# Patient Record
Sex: Male | Born: 1990 | Race: White | Hispanic: No | Marital: Single | Smoking: Current every day smoker
Health system: Southern US, Community
[De-identification: ages and names within clinical notes are randomized; demographics above are authoritative.]

## PROBLEM LIST (undated history)

## (undated) DIAGNOSIS — F319 Bipolar disorder, unspecified: Secondary | ICD-10-CM

## (undated) DIAGNOSIS — A6000 Herpesviral infection of urogenital system, unspecified: Secondary | ICD-10-CM

## (undated) DIAGNOSIS — F1510 Other stimulant abuse, uncomplicated: Secondary | ICD-10-CM

## (undated) DIAGNOSIS — F909 Attention-deficit hyperactivity disorder, unspecified type: Secondary | ICD-10-CM

## (undated) DIAGNOSIS — F419 Anxiety disorder, unspecified: Secondary | ICD-10-CM

## (undated) DIAGNOSIS — F410 Panic disorder [episodic paroxysmal anxiety] without agoraphobia: Secondary | ICD-10-CM

---
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
## 2012-01-10 ENCOUNTER — Encounter (HOSPITAL_COMMUNITY): Payer: Self-pay | Admitting: *Deleted

## 2012-01-10 ENCOUNTER — Emergency Department (HOSPITAL_COMMUNITY)
Admission: EM | Admit: 2012-01-10 | Discharge: 2012-01-10 | Disposition: A | Discharge status: Home / Self Care | Payer: Medicaid Other | Attending: Emergency Medicine | Admitting: Emergency Medicine

## 2012-01-10 DIAGNOSIS — R42 Dizziness and giddiness: Secondary | ICD-10-CM | POA: Insufficient documentation

## 2012-01-10 DIAGNOSIS — R52 Pain, unspecified: Secondary | ICD-10-CM

## 2012-01-10 DIAGNOSIS — F17200 Nicotine dependence, unspecified, uncomplicated: Secondary | ICD-10-CM | POA: Insufficient documentation

## 2012-01-10 MED ORDER — NAPROXEN 500 MG PO TABS: 500.0000 mg | ORAL_TABLET | Freq: Two times a day (BID) | ORAL | Status: DC

## 2012-01-10 MED ORDER — NAPROXEN 250 MG PO TABS
500.0000 mg | ORAL_TABLET | Freq: Once | ORAL | Status: AC
  Administered 2012-01-10: 500 mg via ORAL
  Filled 2012-01-10: qty 2
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
## 2012-01-21 ENCOUNTER — Emergency Department (HOSPITAL_COMMUNITY)
Admission: EM | Admit: 2012-01-21 | Discharge: 2012-01-21 | Discharge status: Against Medical Advice | Payer: Medicaid Other | Attending: Emergency Medicine | Admitting: Emergency Medicine

## 2012-01-21 ENCOUNTER — Encounter (HOSPITAL_COMMUNITY): Payer: Self-pay | Admitting: *Deleted

## 2012-01-21 DIAGNOSIS — R51 Headache: Secondary | ICD-10-CM | POA: Insufficient documentation

## 2012-01-21 DIAGNOSIS — M545 Low back pain, unspecified: Secondary | ICD-10-CM | POA: Insufficient documentation

## 2012-01-21 NOTE — ED Notes (Signed)
No answer
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
## 2012-04-21 ENCOUNTER — Emergency Department (HOSPITAL_COMMUNITY)
Admission: EM | Admit: 2012-04-21 | Discharge: 2012-04-21 | Disposition: A | Discharge status: Home / Self Care | Payer: Medicaid Other | Attending: Emergency Medicine | Admitting: Emergency Medicine

## 2012-04-21 ENCOUNTER — Emergency Department (HOSPITAL_COMMUNITY): Payer: Medicaid Other

## 2012-04-21 ENCOUNTER — Encounter (HOSPITAL_COMMUNITY): Payer: Self-pay | Admitting: *Deleted

## 2012-04-21 DIAGNOSIS — J40 Bronchitis, not specified as acute or chronic: Secondary | ICD-10-CM | POA: Insufficient documentation

## 2012-04-21 DIAGNOSIS — R059 Cough, unspecified: Secondary | ICD-10-CM | POA: Insufficient documentation

## 2012-04-21 DIAGNOSIS — F410 Panic disorder [episodic paroxysmal anxiety] without agoraphobia: Secondary | ICD-10-CM | POA: Insufficient documentation

## 2012-04-21 DIAGNOSIS — Z79899 Other long term (current) drug therapy: Secondary | ICD-10-CM | POA: Insufficient documentation

## 2012-04-21 DIAGNOSIS — R0602 Shortness of breath: Secondary | ICD-10-CM | POA: Insufficient documentation

## 2012-04-21 DIAGNOSIS — F17200 Nicotine dependence, unspecified, uncomplicated: Secondary | ICD-10-CM | POA: Insufficient documentation

## 2012-04-21 DIAGNOSIS — R05 Cough: Secondary | ICD-10-CM | POA: Insufficient documentation

## 2012-04-21 DIAGNOSIS — J9801 Acute bronchospasm: Secondary | ICD-10-CM | POA: Insufficient documentation

## 2012-04-21 DIAGNOSIS — F411 Generalized anxiety disorder: Secondary | ICD-10-CM | POA: Insufficient documentation

## 2012-04-21 MED ORDER — ALBUTEROL SULFATE (5 MG/ML) 0.5% IN NEBU
5.0000 mg | INHALATION_SOLUTION | Freq: Once | RESPIRATORY_TRACT | Status: AC
  Administered 2012-04-21: 5 mg via RESPIRATORY_TRACT
  Filled 2012-04-21: qty 1

## 2012-04-21 MED ORDER — PREDNISONE 10 MG PO TABS: 20.0000 mg | ORAL_TABLET | Freq: Every day | ORAL | Status: DC

## 2012-04-21 MED ORDER — PREDNISONE 50 MG PO TABS
60.0000 mg | ORAL_TABLET | Freq: Once | ORAL | Status: AC
  Administered 2012-04-21: 60 mg via ORAL
  Filled 2012-04-21: qty 1

## 2012-04-21 MED ORDER — IPRATROPIUM BROMIDE 0.02 % IN SOLN
0.5000 mg | Freq: Once | RESPIRATORY_TRACT | Status: AC
  Administered 2012-04-21: 0.5 mg via RESPIRATORY_TRACT
  Filled 2012-04-21: qty 2.5

## 2012-04-21 MED ORDER — ALBUTEROL SULFATE (5 MG/ML) 0.5% IN NEBU: 2.5000 mg | INHALATION_SOLUTION | Freq: Once | RESPIRATORY_TRACT | Status: DC

## 2012-04-21 MED ORDER — AMOXICILLIN 500 MG PO CAPS: 500.0000 mg | ORAL_CAPSULE | Freq: Three times a day (TID) | ORAL | Status: DC

## 2012-04-21 NOTE — ED Notes (Signed)
Nasal congestion since last night.

## 2012-04-21 NOTE — ED Provider Notes (Signed)
History   This chart was scribed for Benny Lennert, MD, by Frederik Pear, ER scribe. The patient was seen in room APA03/APA03 and the patient's care was started at 0901.    CSN: [id number]  Arrival date & time 04/21/12  7829   First MD Initiated Contact with Patient 04/21/12 0901      Chief Complaint  Patient presents with  . Nasal Congestion    (Consider location/radiation/quality/duration/timing/severity/associated sxs/prior treatment) HPI Comments: Scott Huber is a 21 y.o. male with a h/o of asthma who presents to the Emergency Department complaining of constant, moderate SOB with associated productive cough that began last night. He reports that he has been using his albuterol inhaler at home every 4 hours with no relief. He denies any nausea, vomiting, diarrhea, or chills. He states that is a current every day 1 pack a day smoker.       Patient is a 21 y.o. male presenting with shortness of breath. The history is provided by the patient.  Shortness of Breath  The current episode started yesterday. The onset was sudden. The problem occurs continuously. The problem has been unchanged. The problem is moderate. Associated symptoms include cough and shortness of breath. Pertinent negatives include no chest pain. His past medical history is significant for asthma.    Past Medical History  Diagnosis Date  . Anxiety   . Panic attack     History reviewed. No pertinent past surgical history.  No family history on file.  History  Substance Use Topics  . Smoking status: Current Every Day Smoker -- 2.0 packs/day    Types: Cigarettes  . Smokeless tobacco: Never Used  . Alcohol Use: Yes     Comment: occationally      Review of Systems  HENT: Negative for ear discharge.   Eyes: Negative for discharge.  Respiratory: Positive for cough and shortness of breath.   Cardiovascular: Negative for chest pain.  Gastrointestinal: Negative for diarrhea.  Genitourinary: Negative  for frequency and hematuria.  Musculoskeletal: Negative for back pain.  Neurological: Negative for seizures.  Hematological: Negative.   Psychiatric/Behavioral: Negative for hallucinations.  All other systems reviewed and are negative.    Allergies  Hydrocodone and Vistaril  Home Medications   Current Outpatient Rx  Name  Route  Sig  Dispense  Refill  . ALBUTEROL SULFATE HFA 108 (90 BASE) MCG/ACT IN AERS   Inhalation   Inhale 1-2 puffs into the lungs every 6 (six) hours as needed for wheezing.   1 Inhaler   0     BP 138/74  Pulse 95  Temp 98.2 F (36.8 C) (Oral)  Resp 18  Ht 5\' 9"  (1.753 m)  Wt 189 lb (85.73 kg)  BMI 27.91 kg/m2  SpO2 100%  Physical Exam  Constitutional: He is oriented to person, place, and time. He appears well-developed.  HENT:  Head: Normocephalic and atraumatic.  Eyes: Conjunctivae normal and EOM are normal. No scleral icterus.  Neck: Neck supple. No thyromegaly present.  Cardiovascular: Normal rate and regular rhythm.  Exam reveals no gallop and no friction rub.   No murmur heard. Pulmonary/Chest: No stridor. He has wheezes. He has no rales. He exhibits no tenderness.       He has moderate wheezing bilaterally.  Abdominal: He exhibits no distension. There is no tenderness. There is no rebound.  Musculoskeletal: Normal range of motion. He exhibits no edema.  Lymphadenopathy:    He has no cervical adenopathy.  Neurological: He is oriented  to person, place, and time. Coordination normal.  Skin: No rash noted. No erythema.  Psychiatric: He has a normal mood and affect. His behavior is normal.    ED Course  Procedures (including critical care time)  DIAGNOSTIC STUDIES: Oxygen Saturation is 98% on room air, normal by my interpretation.    COORDINATION OF CARE:  09:10- Discussed planned course of treatment with the patient, including a breathing treatment and chest X-ray, who is agreeable at this time.  09:15- Medication Orders- albuterol  (PROVENTIL)(5MG /ML) 0.5% nebulizer 2.5 mg- Once, albuterol (PROVENTIL) (5MG /ML) 0.5% nebulizer solution 5 mg-Once, ipratropium (ATROVENT) nebulizer solution 0.5 mg- Once.  10:17- Recheck- Discussed X-ray results with the pt. He denied any allergies to medication except hydrocodone. Discussed discharging and continuation of his inhalers at home.  Labs Reviewed - No data to display Dg Chest 2 View  04/21/2012  *RADIOLOGY REPORT*  Clinical Data: Congestion, wheezing, smoking.  CHEST - 2 VIEW  Comparison: 01/02/2011 radiograph  Findings: Mild hilar fullness/central peribronchial thickening is similar to priors.  No confluent airspace opacity.  No pleural effusion or pneumothorax.  Cardiac contour within normal range.  No acute osseous finding.  IMPRESSION: Mild peribronchial thickening may be chronic or acute on chronic.   Original Report Authenticated By: Jearld Lesch, M.D.      No diagnosis found.    MDM    The chart was scribed for me under my direct supervision.  I personally performed the history, physical, and medical decision making and all procedures in the evaluation of this patient.Benny Lennert, MD 04/21/12 1018

## 2012-09-07 ENCOUNTER — Encounter (HOSPITAL_COMMUNITY): Payer: Self-pay

## 2012-09-07 ENCOUNTER — Emergency Department (HOSPITAL_COMMUNITY)
Admission: EM | Admit: 2012-09-07 | Discharge: 2012-09-07 | Disposition: A | Discharge status: Home / Self Care | Payer: Medicaid Other | Attending: Emergency Medicine | Admitting: Emergency Medicine

## 2012-09-07 DIAGNOSIS — F410 Panic disorder [episodic paroxysmal anxiety] without agoraphobia: Secondary | ICD-10-CM | POA: Insufficient documentation

## 2012-09-07 DIAGNOSIS — A6000 Herpesviral infection of urogenital system, unspecified: Secondary | ICD-10-CM | POA: Insufficient documentation

## 2012-09-07 DIAGNOSIS — F17200 Nicotine dependence, unspecified, uncomplicated: Secondary | ICD-10-CM | POA: Insufficient documentation

## 2012-09-07 DIAGNOSIS — Z79899 Other long term (current) drug therapy: Secondary | ICD-10-CM | POA: Insufficient documentation

## 2012-09-07 MED ORDER — VALACYCLOVIR HCL 1 G PO TABS: 1000.0000 mg | ORAL_TABLET | Freq: Three times a day (TID) | ORAL | Status: AC

## 2012-09-07 MED ORDER — ACYCLOVIR 400 MG PO TABS: 400.0000 mg | ORAL_TABLET | Freq: Three times a day (TID) | ORAL | Status: DC

## 2012-09-07 NOTE — ED Notes (Signed)
Pt. Is having an outbreak of herpes, Need medication. Pt. Denies any drainage but is having pain

## 2012-09-07 NOTE — ED Notes (Signed)
Pt is here with significant other for herpes outbreak.  States "red spots are about to open up".

## 2012-09-07 NOTE — ED Provider Notes (Signed)
History    This chart was scribed for Marlon Pel (PA) non-physician practitioner working with Doug Sou, MD by Sofie Rower, ED Scribe. This patient was seen in room TR07C/TR07C and the patient's care was started at 4:44PM.   CSN: [id number]  Arrival date & time 09/07/12  1616   First MD Initiated Contact with Patient 09/07/12 1644      Chief Complaint  Patient presents with  . Herpes Zoster    (Consider location/radiation/quality/duration/timing/severity/associated sxs/prior treatment) The history is provided by the patient. No language interpreter was used.    Scott Huber is a 22 y.o. male , with a hx of anxiety and panic attack, who presents to the Emergency Department complaining of sudden, progressively worsening, herpes zoster, onset today (09/07/12). The pt reports he has recently begun to experience an outbreak of herpes, along with his significant other, whom was evaluated within [facility] earlier this afternoon, and now desires medical evaluation and treatment.   The pt is a current everyday smoker, in addition to drinking alcohol occasionally.   PCP is Dr. Dimas Aguas.    Past Medical History  Diagnosis Date  . Anxiety   . Panic attack     History reviewed. No pertinent past surgical history.  No family history on file.  History  Substance Use Topics  . Smoking status: Current Every Day Smoker -- 2.00 packs/day    Types: Cigarettes  . Smokeless tobacco: Never Used  . Alcohol Use: Yes     Comment: occationally      Review of Systems  All other systems reviewed and are negative.    Allergies  Hydrocodone and Vistaril  Home Medications   Current Outpatient Rx  Name  Route  Sig  Dispense  Refill  . acyclovir (ZOVIRAX) 400 MG tablet   Oral   Take 1 tablet (400 mg total) by mouth 3 (three) times daily.   30 tablet   2   . valACYclovir (VALTREX) 1000 MG tablet   Oral   Take 1 tablet (1,000 mg total) by mouth 3 (three) times daily.   21 tablet   0      BP 101/53  Pulse 89  Temp(Src) 98.9 F (37.2 C) (Oral)  Resp 20  SpO2 100%  Physical Exam  Nursing note and vitals reviewed. Constitutional: He is oriented to person, place, and time. He appears well-developed and well-nourished. No distress.  HENT:  Head: Normocephalic and atraumatic.  Eyes: EOM are normal.  Neck: Neck supple. No tracheal deviation present.  Cardiovascular: Normal rate.   Pulmonary/Chest: Effort normal. No respiratory distress.  Musculoskeletal: Normal range of motion.  Neurological: He is alert and oriented to person, place, and time.  Skin: Skin is warm and dry.  Psychiatric: He has a normal mood and affect. His behavior is normal.    ED Course  Procedures (including critical care time)  DIAGNOSTIC STUDIES: Oxygen Saturation is 100% on room air, normal by my interpretation.    COORDINATION OF CARE:  4:47 PM- Treatment plan discussed with patient. Pt agrees with treatment.   acyclovir (ZOVIRAX) 400 MG tablet Take 1 tablet (400 mg total) by mouth 3 (three) times daily. 30 tablet Dorthula Matas, PA-C    Labs Reviewed - No data to display No results found.   1. Genital herpes       MDM  Pt has been advised of the symptoms that warrant their return to the ED. Patient has voiced understanding and has agreed to follow-up with the  PCP or specialist.  I personally performed the services described in this documentation, which was scribed in my presence. The recorded information has been reviewed and is accurate.    Dorthula Matas, PA-C 09/07/12 1651

## 2012-09-08 NOTE — ED Provider Notes (Signed)
Medical screening examination/treatment/procedure(s) were performed by non-physician practitioner and as supervising physician I was immediately available for consultation/collaboration.  Doug Sou, MD 09/08/12 [phone]

## 2012-10-03 ENCOUNTER — Emergency Department (HOSPITAL_COMMUNITY): Payer: Medicaid Other

## 2012-10-03 ENCOUNTER — Emergency Department (HOSPITAL_COMMUNITY)
Admission: EM | Admit: 2012-10-03 | Discharge: 2012-10-03 | Discharge status: Against Medical Advice | Payer: Medicaid Other | Attending: Emergency Medicine | Admitting: Emergency Medicine

## 2012-10-03 ENCOUNTER — Encounter (HOSPITAL_COMMUNITY): Payer: Self-pay | Admitting: Emergency Medicine

## 2012-10-03 DIAGNOSIS — R519 Headache, unspecified: Secondary | ICD-10-CM

## 2012-10-03 DIAGNOSIS — G8929 Other chronic pain: Secondary | ICD-10-CM | POA: Insufficient documentation

## 2012-10-03 DIAGNOSIS — R51 Headache: Secondary | ICD-10-CM | POA: Insufficient documentation

## 2012-10-03 DIAGNOSIS — F411 Generalized anxiety disorder: Secondary | ICD-10-CM | POA: Insufficient documentation

## 2012-10-03 DIAGNOSIS — F17200 Nicotine dependence, unspecified, uncomplicated: Secondary | ICD-10-CM | POA: Insufficient documentation

## 2012-10-03 DIAGNOSIS — H53149 Visual discomfort, unspecified: Secondary | ICD-10-CM | POA: Insufficient documentation

## 2012-10-03 MED ORDER — KETOROLAC TROMETHAMINE 60 MG/2ML IM SOLN
60.0000 mg | Freq: Once | INTRAMUSCULAR | Status: DC
  Filled 2012-10-03: qty 2

## 2012-10-03 NOTE — ED Notes (Signed)
Radiology went in to get patient for head CT, patient no longer in room.

## 2012-10-03 NOTE — ED Notes (Signed)
Pt c/o headache x one month and pressure around his eyes x 2 days.

## 2012-10-03 NOTE — ED Notes (Signed)
Patient having issues with taking a shot and finally refuses. Wants to know how long it will take for the CT scan.

## 2012-10-03 NOTE — ED Provider Notes (Signed)
History     CSN: [id number]  Arrival date & time 10/03/12  4098   First MD Initiated Contact with Patient 10/03/12 1905      Chief Complaint  Patient presents with  . Headache  . Facial Pain    (Consider location/radiation/quality/duration/timing/severity/associated sxs/prior treatment) HPI Comments: Scott Huber is a 22 y.o. Male presenting with a 1 month history of a constant headache which is bilateral and posterior and started within a week after falling off a scaffolding landing in grass on his back and posterior head.  He states he was not evaluated for that injury.  He is unable to sleep,  Although he lies down at night,  He does not feel he has rested when he wakes.  His headache is throbbing in character and does improve despite multiple doses of ibuprofen 800 mg tablets.  He has developed pressure around his eyes over the past several days.  He denies fevers, chills, dizziness,  Nasal congestion, sore throat, visual changes, although states he has increased sensitivity to light,  Denies focal numbness or weakness and denies neck pain or stiffness.     The history is provided by the patient.    Past Medical History  Diagnosis Date  . Anxiety   . Panic attack     History reviewed. No pertinent past surgical history.  History reviewed. No pertinent family history.  History  Substance Use Topics  . Smoking status: Current Every Day Smoker -- 2.00 packs/day    Types: Cigarettes  . Smokeless tobacco: Never Used  . Alcohol Use: Yes     Comment: occationally      Review of Systems  Constitutional: Negative for fever and chills.  HENT: Negative for hearing loss, congestion, sore throat, rhinorrhea, neck pain, neck stiffness, postnasal drip and sinus pressure.   Eyes: Positive for photophobia. Negative for visual disturbance.  Respiratory: Negative for chest tightness and shortness of breath.   Cardiovascular: Negative for chest pain.  Gastrointestinal: Negative  for nausea, vomiting and abdominal pain.  Genitourinary: Negative.   Musculoskeletal: Negative for joint swelling and arthralgias.  Skin: Negative.  Negative for rash and wound.  Neurological: Positive for headaches. Negative for dizziness, weakness, light-headedness and numbness.  Psychiatric/Behavioral: Negative.     Allergies  Hydrocodone and Vistaril  Home Medications   Current Outpatient Rx  Name  Route  Sig  Dispense  Refill  . ibuprofen (ADVIL,MOTRIN) 200 MG tablet   Oral   Take 800 mg by mouth every 6 (six) hours as needed for pain.           BP 116/69  Pulse 66  Temp(Src) 97.6 F (36.4 C) (Oral)  Resp 16  Ht 5\' 9"  (1.753 m)  Wt 179 lb (81.194 kg)  BMI 26.42 kg/m2  SpO2 99%  Physical Exam  Nursing note and vitals reviewed. Constitutional: He is oriented to person, place, and time. He appears well-developed and well-nourished.  Uncomfortable appearing  HENT:  Head: Normocephalic and atraumatic.  Mouth/Throat: Oropharynx is clear and moist.  Eyes: EOM are normal. Pupils are equal, round, and reactive to light.  Neck: Normal range of motion. Neck supple.  Cardiovascular: Normal rate and normal heart sounds.   Pulmonary/Chest: Effort normal.  Abdominal: Soft. There is no tenderness.  Musculoskeletal: Normal range of motion.  Lymphadenopathy:    He has no cervical adenopathy.  Neurological: He is alert and oriented to person, place, and time. He has normal strength. No sensory deficit. Gait normal. GCS eye  subscore is 4. GCS verbal subscore is 5. GCS motor subscore is 6.  Normal heel-shin, normal rapid alternating movements. Cranial nerves III-XII intact.  No pronator drift.  Skin: Skin is warm and dry. No rash noted.  Psychiatric: His speech is normal and behavior is normal. Thought content normal. His mood appears anxious. Cognition and memory are normal.    ED Course  Procedures (including critical care time)  Labs Reviewed - No data to display No  results found.   1. 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  Pt left [facility] prior to receiving Ct.  He did not inform staff he was leaving.        Burgess Amor, PA-C 10/03/12 2056

## 2012-10-04 NOTE — ED Provider Notes (Signed)
Medical screening examination/treatment/procedure(s) were performed by non-physician practitioner and as supervising physician I was immediately available for consultation/collaboration.  Juliet Rude. Rubin Payor, MD 10/04/12 4098

## 2012-12-17 ENCOUNTER — Emergency Department (HOSPITAL_COMMUNITY)
Admission: EM | Admit: 2012-12-17 | Discharge: 2012-12-17 | Disposition: A | Discharge status: Home / Self Care | Payer: Medicaid Other | Attending: Emergency Medicine | Admitting: Emergency Medicine

## 2012-12-17 DIAGNOSIS — Z760 Encounter for issue of repeat prescription: Secondary | ICD-10-CM | POA: Insufficient documentation

## 2012-12-17 DIAGNOSIS — F411 Generalized anxiety disorder: Secondary | ICD-10-CM | POA: Insufficient documentation

## 2012-12-17 DIAGNOSIS — R450 Nervousness: Secondary | ICD-10-CM | POA: Insufficient documentation

## 2012-12-17 DIAGNOSIS — F17200 Nicotine dependence, unspecified, uncomplicated: Secondary | ICD-10-CM | POA: Insufficient documentation

## 2012-12-17 DIAGNOSIS — A6000 Herpesviral infection of urogenital system, unspecified: Secondary | ICD-10-CM | POA: Insufficient documentation

## 2012-12-17 DIAGNOSIS — IMO0002 Reserved for concepts with insufficient information to code with codable children: Secondary | ICD-10-CM | POA: Insufficient documentation

## 2012-12-17 MED ORDER — ACYCLOVIR 400 MG PO TABS: 400.0000 mg | ORAL_TABLET | Freq: Three times a day (TID) | ORAL | Status: DC

## 2012-12-17 NOTE — ED Notes (Signed)
Pt alert & oriented x4, stable gait. Patient  given discharge instructions, paperwork & prescription(s). Patient verbalized understanding. Pt left department w/ no further questions. 

## 2012-12-17 NOTE — ED Notes (Signed)
States that he has herpes and needs a refill on his medication.

## 2012-12-17 NOTE — ED Provider Notes (Signed)
CSN: [id number]     Arrival date & time 12/17/12  1708 History     First MD Initiated Contact with Patient 12/17/12 1858     Chief Complaint  Patient presents with  . Exposure to STD  . Medication Refill   (Consider location/radiation/quality/duration/timing/severity/associated sxs/prior Treatment) HPI Comments: Patient states that he has had problems with genital herpes for some time now. He states that he has been on acyclovir, but is now out of the medication. He states he is experiencing an outbreak now. He denies fever chills, nausea vomiting,. He denies any unusual headache. There's been no involvement of his eyes. The patient states he has not been seen by the health department or any other practitioner since his last visit to the emergency department.  The history is provided by the patient.    Past Medical History  Diagnosis Date  . Anxiety   . Panic attack    No past surgical history on file. No family history on file. History  Substance Use Topics  . Smoking status: Current Every Day Smoker -- 2.00 packs/day    Types: Cigarettes  . Smokeless tobacco: Never Used  . Alcohol Use: Yes     Comment: occationally    Review of Systems  Constitutional: Negative for activity change.       All ROS Neg except as noted in HPI  HENT: Negative for nosebleeds and neck pain.   Eyes: Negative for photophobia and discharge.  Respiratory: Negative for cough, shortness of breath and wheezing.   Cardiovascular: Negative for chest pain and palpitations.  Gastrointestinal: Negative for abdominal pain and blood in stool.  Genitourinary: Positive for penile pain. Negative for dysuria, frequency and hematuria.  Musculoskeletal: Negative for back pain and arthralgias.  Skin: Negative.   Neurological: Negative for dizziness, seizures and speech difficulty.  Psychiatric/Behavioral: Negative for hallucinations and confusion. The patient is nervous/anxious.     Allergies  Hydrocodone and  Vistaril  Home Medications   Current Outpatient Rx  Name  Route  Sig  Dispense  Refill  . ibuprofen (ADVIL,MOTRIN) 200 MG tablet   Oral   Take 800 mg by mouth every 6 (six) hours as needed for pain.          BP 111/70  Pulse 67  Temp(Src) 98.4 F (36.9 C) (Oral)  Resp 18  Ht 5\' 9"  (1.753 m)  Wt 176 lb 2 oz (79.89 kg)  BMI 26 kg/m2  SpO2 98% Physical Exam  Nursing note and vitals reviewed. Constitutional: He is oriented to person, place, and time. He appears well-developed and well-nourished.  Non-toxic appearance.  HENT:  Head: Normocephalic.  Right Ear: Tympanic membrane and external ear normal.  Left Ear: Tympanic membrane and external ear normal.  Eyes: EOM and lids are normal. Pupils are equal, round, and reactive to light.  Neck: Normal range of motion. Neck supple. Carotid bruit is not present.  Cardiovascular: Normal rate, regular rhythm, normal heart sounds, intact distal pulses and normal pulses.   Pulmonary/Chest: Breath sounds normal. No respiratory distress.  Abdominal: Soft. Bowel sounds are normal. There is no tenderness. There is no guarding.  Genitourinary: Penile tenderness present.  Musculoskeletal: Normal range of motion.  Lymphadenopathy:       Head (right side): No submandibular adenopathy present.       Head (left side): No submandibular adenopathy present.    He has no cervical adenopathy.  Neurological: He is alert and oriented to person, place, and time. He has normal strength.  No cranial nerve deficit or sensory deficit.  Skin: Skin is warm and dry.  Psychiatric: He has a normal mood and affect. His speech is normal.    ED Course   Procedures (including critical care time)  Labs Reviewed - No data to display No results found. No diagnosis found.  MDM  *I have reviewed nursing notes, vital signs, and all appropriate lab and imaging results for this patient.** Patient has a history of genital herpes, with a current outbreak. The patient  states that he is out of his acyclovir, and has been out quite some time. He has not seen a medical professionals since his last emergency department visit. I have instructed the patient on the importance of being followed at the local health department. Prescription for acyclovir  400 mg 3 times daily #30 tablets given to the patient.  Kathie Dike, PA-C 12/17/12 [phone]

## 2012-12-22 NOTE — ED Provider Notes (Signed)
History/physical exam/procedure(s) were performed by non-physician practitioner and as supervising physician I was immediately available for consultation/collaboration. I have reviewed all notes and am in agreement with care and plan.   Kinsleigh Ludolph S Dyna Figuereo, MD 12/22/12 1242 

## 2013-11-01 ENCOUNTER — Encounter (HOSPITAL_COMMUNITY): Payer: Self-pay | Admitting: Emergency Medicine

## 2013-11-01 ENCOUNTER — Emergency Department (HOSPITAL_COMMUNITY)
Admission: EM | Admit: 2013-11-01 | Discharge: 2013-11-01 | Disposition: A | Discharge status: Home / Self Care | Payer: Medicaid Other | Attending: Emergency Medicine | Admitting: Emergency Medicine

## 2013-11-01 DIAGNOSIS — Z79899 Other long term (current) drug therapy: Secondary | ICD-10-CM | POA: Insufficient documentation

## 2013-11-01 DIAGNOSIS — Z760 Encounter for issue of repeat prescription: Secondary | ICD-10-CM | POA: Diagnosis present

## 2013-11-01 DIAGNOSIS — Z8659 Personal history of other mental and behavioral disorders: Secondary | ICD-10-CM | POA: Diagnosis not present

## 2013-11-01 DIAGNOSIS — F17200 Nicotine dependence, unspecified, uncomplicated: Secondary | ICD-10-CM | POA: Insufficient documentation

## 2013-11-01 DIAGNOSIS — A6000 Herpesviral infection of urogenital system, unspecified: Secondary | ICD-10-CM | POA: Insufficient documentation

## 2013-11-01 MED ORDER — ACYCLOVIR 400 MG PO TABS: 400.0000 mg | ORAL_TABLET | Freq: Three times a day (TID) | ORAL | Status: DC

## 2013-11-01 NOTE — ED Provider Notes (Signed)
CSN: [id number]     Arrival date & time 11/01/13  2116 History   First MD Initiated Contact with Patient 11/01/13 2214     Chief Complaint  Patient presents with  . Medication Refill    (Consider location/radiation/quality/duration/timing/severity/associated sxs/prior Treatment) HPI Comments: Patient is a 23 year old male with a history of genital herpes who presents to the emergency department for acyclovir your refill. Patient states that he began another herpes outbreak one month ago and that he ran out of his medication for management of this. Patient states that his outbreak has been resolving spontaneously, but is still present and painful. Patient denies associated fever, abdominal pain, nausea or vomiting, penile discharge, scrotal swelling, and testicular tenderness.  The history is provided by the patient. No language interpreter was used.    Past Medical History  Diagnosis Date  . Anxiety   . Panic attack    History reviewed. No pertinent past surgical history. History reviewed. No pertinent family history. History  Substance Use Topics  . Smoking status: Current Every Day Smoker -- 2.00 packs/day    Types: Cigarettes  . Smokeless tobacco: Never Used  . Alcohol Use: Yes     Comment: occationally    Review of Systems  Genitourinary:       +penile lesions  All other systems reviewed and are negative.   Allergies  Hydrocodone and Vistaril  Home Medications   Prior to Admission medications   Medication Sig Start Date End Date Taking? Authorizing Kadir Azucena  acyclovir (ZOVIRAX) 400 MG tablet Take 1 tablet (400 mg total) by mouth 3 (three) times daily. 11/01/13   Antony MaduraKelly Humes, PA-C  ibuprofen (ADVIL,MOTRIN) 200 MG tablet Take 800 mg by mouth every 6 (six) hours as needed for pain.    Historical Vitalia Stough, MD   BP 123/65  Pulse 91  Temp(Src) 98.3 F (36.8 C) (Oral)  Resp 18  Ht 5\' 10"  (1.778 m)  Wt 175 lb (79.379 kg)  BMI 25.11 kg/m2  SpO2 98%  Physical Exam   Nursing note and vitals reviewed. Constitutional: He is oriented to person, place, and time. He appears well-developed and well-nourished. No distress.  Nontoxic/nonseptic appearing  HENT:  Head: Normocephalic and atraumatic.  Eyes: Conjunctivae and EOM are normal. No scleral icterus.  Neck: Normal range of motion.  Pulmonary/Chest: Effort normal. No respiratory distress.  Abdominal: Soft. He exhibits no distension. There is no tenderness. There is no rebound and no guarding.  Soft and nontender. No peritoneal signs.  Genitourinary: Testes normal. Right testis shows no mass, no swelling and no tenderness. Right testis is descended. Left testis shows no mass, no swelling and no tenderness. Left testis is descended. Circumcised. No penile tenderness. No discharge found.  Chaperoned GU exam significant for lesions consistent with genital herpes present on penile shaft at the base of the glans penis.  Musculoskeletal: Normal range of motion.  Neurological: He is alert and oriented to person, place, and time.  Skin: Skin is warm and dry. No rash noted. He is not diaphoretic. No erythema. No pallor.  Psychiatric: He has a normal mood and affect. His behavior is normal.    ED Course  Procedures (including critical care time) Labs Review Labs Reviewed - No data to display  Imaging Review No results found.   EKG Interpretation None      MDM   Final diagnoses:  Genital herpes  Encounter for medication refill    Uncomplicated medication refill for acyclovir. Patient with history of genital herpes. Physical  exam consistent with herpes outbreak. No complicating physical exam findings. Patient nontoxic/nonseptic appearing, hemodynamically stable, and afebrile. Patient stable for discharge with prescription for acyclovir. Have advised primary care followup as needed. Return precautions provided and patient agreeable to plan with no unaddressed concerns.   Filed Vitals:   11/01/13 2200   BP: 123/65  Pulse: 91  Temp: 98.3 F (36.8 C)  TempSrc: Oral  Resp: 18  Height: 5\' 10"  (1.778 m)  Weight: 175 lb (79.379 kg)  SpO2: 98%       Antony MaduraKelly Humes, PA-C 11/01/13 2227

## 2013-11-01 NOTE — ED Notes (Signed)
Needs Acyclovir refilled.  States he needs it really bad

## 2013-11-01 NOTE — Discharge Instructions (Signed)
Genital Herpes °Genital herpes is a sexually transmitted disease. This means that it is a disease passed by having sex with an infected person. There is no cure for genital herpes. The time between attacks can be months to years. The virus may live in a person but produce no problems (symptoms). This infection can be passed to a baby as it travels down the birth canal (vagina). In a newborn, this can cause central nervous system damage, eye damage, or even death. The virus that causes genital herpes is usually HSV-2 virus. The virus that causes oral herpes is usually HSV-1. The diagnosis (learning what is wrong) is made through culture results. °SYMPTOMS  °Usually symptoms of pain and itching begin a few days to a week after contact. It first appears as small blisters that progress to small painful ulcers which then scab over and heal after several days. It affects the outer genitalia, birth canal, cervix, penis, anal area, buttocks, and thighs. °HOME CARE INSTRUCTIONS  °· Keep ulcerated areas dry and clean. °· Take medications as directed. Antiviral medications can speed up healing. They will not prevent recurrences or cure this infection. These medications can also be taken for suppression if there are frequent recurrences. °· While the infection is active, it is contagious. Avoid all sexual contact during active infections. °· Condoms may help prevent spread of the herpes virus. °· Practice safe sex. °· Wash your hands thoroughly after touching the genital area. °· Avoid touching your eyes after touching your genital area. °· Inform your caregiver if you have had genital herpes and become pregnant. It is your responsibility to insure a safe outcome for your baby in this pregnancy. °· Only take over-the-counter or prescription medicines for pain, discomfort, or fever as directed by your caregiver. °SEEK MEDICAL CARE IF:  °· You have a recurrence of this infection. °· You do not respond to medications and are not  improving. °· You have new sources of pain or discharge which have changed from the original infection. °· You have an oral temperature above 102° F (38.9° C). °· You develop abdominal pain. °· You develop eye pain or signs of eye infection. °Document Released: 04/23/2000 Document Revised: 07/19/2011 Document Reviewed: 05/14/2009 °ExitCare® Patient Information ©2015 [employer]. This information is not intended to replace advice given to you by your health care provider. Make sure you discuss any questions you have with your health care provider. ° °

## 2013-11-02 NOTE — ED Provider Notes (Signed)
Medical screening examination/treatment/procedure(s) were performed by non-physician practitioner and as supervising physician I was immediately available for consultation/collaboration.   EKG Interpretation None       Nathan R. Pickering, MD 11/02/13 0023 

## 2014-03-03 ENCOUNTER — Emergency Department (HOSPITAL_COMMUNITY)
Admission: EM | Admit: 2014-03-03 | Discharge: 2014-03-04 | Disposition: A | Discharge status: Home / Self Care | Payer: Medicaid Other | Attending: Emergency Medicine | Admitting: Emergency Medicine

## 2014-03-03 ENCOUNTER — Encounter (HOSPITAL_COMMUNITY): Payer: Self-pay | Admitting: Emergency Medicine

## 2014-03-03 DIAGNOSIS — N508 Other specified disorders of male genital organs: Secondary | ICD-10-CM | POA: Diagnosis present

## 2014-03-03 DIAGNOSIS — Z8619 Personal history of other infectious and parasitic diseases: Secondary | ICD-10-CM | POA: Insufficient documentation

## 2014-03-03 DIAGNOSIS — I861 Scrotal varices: Secondary | ICD-10-CM | POA: Insufficient documentation

## 2014-03-03 DIAGNOSIS — Z8659 Personal history of other mental and behavioral disorders: Secondary | ICD-10-CM | POA: Diagnosis not present

## 2014-03-03 DIAGNOSIS — Z720 Tobacco use: Secondary | ICD-10-CM | POA: Insufficient documentation

## 2014-03-03 DIAGNOSIS — N50812 Left testicular pain: Secondary | ICD-10-CM

## 2014-03-03 HISTORY — DX: Herpesviral infection of urogenital system, unspecified: A60.00

## 2014-03-03 NOTE — Discharge Instructions (Signed)
Scrotal Masses Scrotal swelling is common in men of all ages. Common types of testicular masses include:   Hydrocele. The most common benign testicular mass in an adult. Hydroceles are generally soft and painless collections of fluid in the scrotal sac. These can rapidly change size as the fluid enters or leaves. Hydroceles can be associated with an underlying cancer of the testicle.  Spermatoceles. Generally soft and painless cyst-like masses in the scrotum that contain fluid, usually above the testicle. They can rapidly change size as the fluid enters or leaves. They are more prominent while standing or exercising. Sometimes, spermatoceles may cause a sensation of heaviness or a dull ache.  Orchitis. Inflammation of the testicle. It is painful and may be associated with a fever or symptoms of a urinary tract infection, including frequent and painful urination. It is common in males who have the mumps.  Varicocele. An enlargement of the veins that drain the testicles. Varicoceles usually occur on the left side of the scrotum. This condition can increase the risk of infertility. Varicocele is sometimes more prominent while standing or exercising. Sometimes, varicoceles may cause a sensation of heaviness or a dull ache.  Inguinal hernia. A bulge caused by a portion of intestine protruding into the scrotum through a weak area in the abdominal muscles. Hernias may or may not be painful. They are soft and usually enlarge with coughing or straining.  Torsion of the testis. This can cause a testicular mass that develops quickly and is associated with tenderness or fever, or both. It is caused by a twisting of the testicle within the sac. It also reduces the blood supply and can destroy the testis if not treated quickly with surgery.  Epididymitis. Inflammation of the epididymis (a structure attached above and behind the testicle), usually caused by a urinary tract infection or a sexually transmitted  infection. This generally shows up as testicular discomfort and swelling and may include pain during urination. It is frequently associated with a testicle infection.  Testicular appendages. Remnants of tissue on the testis present since birth. A testicular appendage can twist on its blood supply and cause pain. In most cases, this is seen as a blue dot on the scrotum.  Hematocele. A collection of blood between the layers of the sac inside the scrotum. It usually is caused by trauma to the scrotum.  Sebaceous cysts. These can be a swelling in the skin of the scrotum and are usually painless.  Cancer (carcinoma) of the skin of the scrotum. It can cause scrotal swelling, but this is rare. Document Released: 10/31/2002 Document Revised: 12/27/2012 Document Reviewed: 10/16/2012 ExitCare Patient Information 2015 [employer]. This information is not intended to replace advice given to you by your health care provider. Make sure you discuss any questions you have with your health care provider.  

## 2014-03-03 NOTE — ED Provider Notes (Signed)
CSN: [id number]     Arrival date & time 03/03/14  2115 History  This chart was scribed for Joya Gaskinsonald W Samone Guhl, MD by Richarda Overlieichard Holland, ED Scribe. This patient was seen in room [facility] and the patient's care was started 11:15 PM.  Chief complaint - scrotal pain  The history is provided by the patient. No language interpreter was used.   HPI Comments: Scott Huber is a 23 y.o. male who presents to the Emergency Department complaining of left testicular pain that worsened tonight.Pt reports he has had a knot on his left testicle for about a year that has been checked out previously. Pt denies any injury to the area. He denies fever, vomiting, penile discharge, abdominal pain, and dysuria as symptoms. He reports no modifying factors at this time.    Past Medical History  Diagnosis Date  . Anxiety   . Panic attack   . Genital herpes    History reviewed. No pertinent past surgical history. History reviewed. No pertinent family history. History  Substance Use Topics  . Smoking status: Current Every Day Smoker -- 2.00 packs/day    Types: Cigarettes  . Smokeless tobacco: Never Used  . Alcohol Use: Yes     Comment: occationally    Review of Systems  Constitutional: Negative for fever.  Gastrointestinal: Negative for vomiting and abdominal pain.  Genitourinary: Positive for testicular pain. Negative for dysuria and discharge.      Allergies  Hydrocodone and Vistaril  Home Medications   Prior to Admission medications   Not on File   BP 114/71  Pulse 91  Temp(Src) 98.1 F (36.7 C) (Oral)  Resp 24  Ht 5\' 10"  (1.778 m)  SpO2 100% Physical Exam  Nursing note and vitals reviewed.  CONSTITUTIONAL: Well developed/well nourished HEAD: Normocephalic/atraumatic EYES: EOMI ENMT: Mucous membranes moist NECK: supple no meningeal signs CV: S1/S2 noted, no murmurs/rubs/gallops noted LUNGS: Lungs are clear to auscultation bilaterally, no apparent distress ABDOMEN: soft,  nontender, no rebound or guarding GU:no cva tenderness, varicocele to left testicle. Mild tenderness noted, no erythema or swelling. No hernia, chaperone present.  NEURO: Pt is awake/alert, moves all extremitiesx4 EXTREMITIES: pulses normal, full ROM SKIN: warm, color normal PSYCH: no abnormalities of mood noted  ED Course  Procedures  DIAGNOSTIC STUDIES: Oxygen Saturation is 100% on RA, normal by my interpretation.    COORDINATION OF CARE: 11:26 PM Discussed treatment plan with pt at bedside and pt agreed to plan.  Highly suspicious for varicocele given exam/history However I advised that I can NOT rule out testicular cancer by exam only Advised close urology followup and likely need for outpatient ultrasound for evaluation His history is not c/w acute testicular torsion Pt agrees with plan   MDM   Final diagnoses:  Pain in left testicle  Left varicocele    Nursing notes including past medical history and social history reviewed and considered in documentation   I personally performed the services described in this documentation, which was scribed in my presence. The recorded information has been reviewed and is accurate.      Joya Gaskinsonald W Tucker Steedley, MD 03/04/14 [id number]

## 2014-03-03 NOTE — ED Notes (Addendum)
Pt states he has had a knot in his left testicle for about a year.

## 2014-03-05 ENCOUNTER — Ambulatory Visit (INDEPENDENT_AMBULATORY_CARE_PROVIDER_SITE_OTHER): Payer: Medicaid Other | Admitting: Urology

## 2014-03-05 DIAGNOSIS — I861 Scrotal varices: Secondary | ICD-10-CM

## 2014-03-05 DIAGNOSIS — Z720 Tobacco use: Secondary | ICD-10-CM

## 2014-05-12 ENCOUNTER — Emergency Department (HOSPITAL_COMMUNITY)
Admission: EM | Admit: 2014-05-12 | Discharge: 2014-05-12 | Disposition: A | Discharge status: Home / Self Care | Payer: Medicaid Other | Attending: Emergency Medicine | Admitting: Emergency Medicine

## 2014-05-12 ENCOUNTER — Encounter (HOSPITAL_COMMUNITY): Payer: Self-pay | Admitting: Emergency Medicine

## 2014-05-12 DIAGNOSIS — A6002 Herpesviral infection of other male genital organs: Secondary | ICD-10-CM

## 2014-05-12 DIAGNOSIS — Z8659 Personal history of other mental and behavioral disorders: Secondary | ICD-10-CM | POA: Diagnosis not present

## 2014-05-12 DIAGNOSIS — Z202 Contact with and (suspected) exposure to infections with a predominantly sexual mode of transmission: Secondary | ICD-10-CM | POA: Diagnosis present

## 2014-05-12 DIAGNOSIS — Z720 Tobacco use: Secondary | ICD-10-CM | POA: Insufficient documentation

## 2014-05-12 LAB — URINALYSIS, ROUTINE W REFLEX MICROSCOPIC
Bilirubin Urine: NEGATIVE
Glucose, UA: NEGATIVE mg/dL
Hgb urine dipstick: NEGATIVE
Ketones, ur: NEGATIVE mg/dL
LEUKOCYTES UA: NEGATIVE
NITRITE: NEGATIVE
Protein, ur: NEGATIVE mg/dL
SPECIFIC GRAVITY, URINE: 1.013 (ref 1.005–1.030)
UROBILINOGEN UA: 1 mg/dL (ref 0.0–1.0)
pH: 6 (ref 5.0–8.0)

## 2014-05-12 MED ORDER — ACYCLOVIR 800 MG PO TABS: 800.0000 mg | ORAL_TABLET | Freq: Two times a day (BID) | ORAL | Status: DC

## 2014-05-12 NOTE — ED Notes (Signed)
Pt refusing GC/Chlmydia swab

## 2014-05-12 NOTE — ED Provider Notes (Signed)
CSN: [id number]     Arrival date & time 05/12/14  2143 History  This chart was scribed for non-physician practitioner, Teressa Lower, NP working with Enid Skeens, MD, by Lionel December, ED Scribe. This patient was seen in room TR08C/TR08C and the patient's care was started at 9:57 PM.   Chief Complaint  Patient presents with  . SEXUALLY TRANSMITTED DISEASE     (Consider location/radiation/quality/duration/timing/severity/associated sxs/prior Treatment) The history is provided by the patient. No language interpreter was used.    HPI Comments: Scott Huber is a 24 y.o. male who presents to the Emergency Department complaining of a penile rash and mild dysuria onset 7 days ago.  He notes that it may be an outbreak of possible herpes. Patient states that he has had a similar outbreak a month ago but states that it resolved on its own without being treated. He denies penile discharge, fever, or chills.    Past Medical History  Diagnosis Date  . Anxiety   . Panic attack   . Genital herpes    History reviewed. No pertinent past surgical history. No family history on file. History  Substance Use Topics  . Smoking status: Current Every Day Smoker -- 2.00 packs/day    Types: Cigarettes  . Smokeless tobacco: Never Used  . Alcohol Use: Yes     Comment: occationally    Review of Systems  Constitutional: Negative for fever and chills.  Genitourinary: Positive for dysuria. Negative for discharge.  Skin: Positive for rash.  All other systems reviewed and are negative.     Allergies  Hydrocodone and Vistaril  Home Medications   Prior to Admission medications   Not on File   BP 124/72 mmHg  Pulse 130  Temp(Src) 97.8 F (36.6 C) (Oral)  Resp 16  SpO2 98% Physical Exam  Constitutional: He is oriented to person, place, and time.  Cardiovascular: Normal rate and regular rhythm.   Pulmonary/Chest: Effort normal and breath sounds normal.  Abdominal: Soft. Bowel sounds are  normal. There is no tenderness.  Genitourinary:  Raised bumps noted to penis:no discharge noted  Musculoskeletal: Normal range of motion.  Neurological: He is alert and oriented to person, place, and time.  Skin: Skin is warm and dry.  Psychiatric: He has a normal mood and affect.  Nursing note and vitals reviewed.   ED Course  Procedures (including critical care time) DIAGNOSTIC STUDIES: Oxygen Saturation is 98% on RA, normal by my interpretation.    COORDINATION OF CARE: 10:03 PM Discussed treatment plan with patient at beside, the patient agrees with the plan and has no further questions at this time.  Labs Review Labs Reviewed  GC/CHLAMYDIA PROBE AMP  URINALYSIS, ROUTINE W REFLEX MICROSCOPIC  HIV ANTIBODY (ROUTINE TESTING)  RPR    Imaging Review No results found.   EKG Interpretation None      MDM   Final diagnoses:  Herpes genitalis in men    Pt treat with acyclovir.std work up done  I personally performed the services described in this documentation, which was scribed in my presence. The recorded information has been reviewed and is accurate.  Teressa Lower, NP 05/13/14 0010  Enid Skeens, MD 05/13/14 [phone]

## 2014-05-12 NOTE — Discharge Instructions (Signed)
Herpes Simplex Herpes simplex is generally classified as Type 1 or Type 2. Type 1 is generally the type that is responsible for cold sores. Type 2 is generally associated with sexually transmitted diseases. We now know that most of the thoughts on these viruses are inaccurate. We find that HSV1 is also present genitally and HSV2 can be present orally, but this will vary in different locations of the world. Herpes simplex is usually detected by doing a culture. Blood tests are also available for this virus; however, the accuracy is often not as good.  PREPARATION FOR TEST No preparation or fasting is necessary. NORMAL FINDINGS  No virus present  No HSV antigens or antibodies present Ranges for normal findings may vary among different laboratories and hospitals. You should always check with your doctor after having lab work or other tests done to discuss the meaning of your test results and whether your values are considered within normal limits. MEANING OF TEST  Your caregiver will go over the test results with you and discuss the importance and meaning of your results, as well as treatment options and the need for additional tests if necessary. OBTAINING THE TEST RESULTS  It is your responsibility to obtain your test results. Ask the lab or department performing the test when and how you will get your results. Document Released: 05/29/2004 Document Revised: 07/19/2011 Document Reviewed: 04/06/2008 Exit[employer] Patient Information 2015 [employer]. This information is not intended to replace advice given to you by your health care provider. Make sure you discuss any questions you have with your health care provider.  

## 2014-05-12 NOTE — ED Notes (Signed)
Pt. requesting STD screening , reports recurring genital herpes with penile rash and mild dysuria onset last week . Denies fever or chills. No penile discharge .

## 2014-05-13 LAB — RPR

## 2014-05-13 LAB — HIV ANTIBODY (ROUTINE TESTING W REFLEX): HIV 1&2 Ab, 4th Generation: NONREACTIVE

## 2014-05-15 LAB — GC/CHLAMYDIA PROBE AMP
CT PROBE, AMP APTIMA: NEGATIVE
GC PROBE AMP APTIMA: NEGATIVE

## 2015-02-07 ENCOUNTER — Encounter (HOSPITAL_COMMUNITY): Payer: Self-pay | Admitting: *Deleted

## 2015-02-07 ENCOUNTER — Emergency Department (HOSPITAL_COMMUNITY): Payer: Medicaid Other

## 2015-02-07 ENCOUNTER — Emergency Department (HOSPITAL_COMMUNITY)
Admission: EM | Admit: 2015-02-07 | Discharge: 2015-02-07 | Discharge status: Against Medical Advice | Payer: Medicaid Other | Attending: Emergency Medicine | Admitting: Emergency Medicine

## 2015-02-07 DIAGNOSIS — Z8619 Personal history of other infectious and parasitic diseases: Secondary | ICD-10-CM | POA: Insufficient documentation

## 2015-02-07 DIAGNOSIS — Z720 Tobacco use: Secondary | ICD-10-CM | POA: Insufficient documentation

## 2015-02-07 DIAGNOSIS — Z79899 Other long term (current) drug therapy: Secondary | ICD-10-CM | POA: Diagnosis not present

## 2015-02-07 DIAGNOSIS — Z8659 Personal history of other mental and behavioral disorders: Secondary | ICD-10-CM | POA: Insufficient documentation

## 2015-02-07 DIAGNOSIS — R109 Unspecified abdominal pain: Secondary | ICD-10-CM | POA: Insufficient documentation

## 2015-02-07 NOTE — ED Notes (Signed)
Dr. Blinda Leatherwood saw pt running out. Informed of AMA status.

## 2015-02-07 NOTE — ED Notes (Signed)
Rad tech in to pick up pt. Pt stated he had to go and left running out.

## 2015-02-07 NOTE — ED Notes (Signed)
Abdominal pain

## 2015-02-07 NOTE — ED Provider Notes (Signed)
CSN: [id number]     Arrival date & time 02/07/15  2118 History  By signing my name below, I, Murriel Hopper, attest that this documentation has been prepared under the direction and in the presence of Gilda Crease, MD. Electronically Signed: Murriel Hopper, ED Scribe. 02/07/2015. 9:32 PM.    Chief Complaint  Patient presents with  . Abdominal Pain      The history is provided by the patient. No language interpreter was used.   HPI Comments: Scott Huber is a 24 y.o. male who presents to the Emergency Department complaining of intermittent abdominal pain that has been present for a few months. Pt states that he had abdominal pain today and states he came to ED today "just to make sure everything is okay". Pt states that nothing in particular brings on his pain and nothing makes it worse. Pt denies vomiting, constipation, diarrhea.     Past Medical History  Diagnosis Date  . Anxiety   . Panic attack   . Genital herpes    No past surgical history on file. No family history on file. Social History  Substance Use Topics  . Smoking status: Current Every Day Smoker -- 2.00 packs/day    Types: Cigarettes  . Smokeless tobacco: Never Used  . Alcohol Use: Yes     Comment: occationally    Review of Systems  Gastrointestinal: Positive for abdominal pain. Negative for nausea, vomiting and diarrhea.  All other systems reviewed and are negative.     Allergies  Hydrocodone and Vistaril  Home Medications   Prior to Admission medications   Medication Sig Start Date End Date Taking? Authorizing Provider  acyclovir (ZOVIRAX) 800 MG tablet Take 1 tablet (800 mg total) by mouth 2 (two) times daily. 05/12/14   Teressa Lower, NP   There were no vitals taken for this visit. Physical Exam  Constitutional: He is oriented to person, place, and time. He appears well-developed and well-nourished. No distress.  HENT:  Head: Normocephalic and atraumatic.  Right Ear: Hearing normal.   Left Ear: Hearing normal.  Nose: Nose normal.  Mouth/Throat: Oropharynx is clear and moist and mucous membranes are normal.  Eyes: Conjunctivae and EOM are normal. Pupils are equal, round, and reactive to light.  Neck: Normal range of motion. Neck supple.  Cardiovascular: Regular rhythm, S1 normal and S2 normal.  Exam reveals no gallop and no friction rub.   No murmur heard. Pulmonary/Chest: Effort normal and breath sounds normal. No respiratory distress. He exhibits no tenderness.  Abdominal: Soft. Normal appearance and bowel sounds are normal. There is no hepatosplenomegaly. There is no tenderness. There is no rebound, no guarding, no tenderness at McBurney's point and negative Murphy's sign. No hernia.  Musculoskeletal: Normal range of motion.  Neurological: He is alert and oriented to person, place, and time. He has normal strength. No cranial nerve deficit or sensory deficit. Coordination normal. GCS eye subscore is 4. GCS verbal subscore is 5. GCS motor subscore is 6.  Skin: Skin is warm, dry and intact. No rash noted. No cyanosis.  Psychiatric: He has a normal mood and affect. His speech is normal and behavior is normal. Thought content normal.  Nursing note and vitals reviewed.   ED Course  Procedures (including critical care time)  DIAGNOSTIC STUDIES: Oxygen Saturation is 98% on room air, normal by my interpretation.    COORDINATION OF CARE: 9:32 PM Discussed treatment plan with pt at bedside and pt agreed to plan.   Labs Review  Labs Reviewed - No data to display  Imaging Review No results found. I have personally reviewed and evaluated these images and lab results as part of my medical decision-making.   EKG Interpretation None      MDM   Final diagnoses:  None   abdominal pain   Patient presented to the ER for evaluation of abdominal pain. He reports that the pain has been present intermittently for months. He cannot identify a the abdomen hurts when it is  present. It is not currently present. Examination was benign. X-ray was ordered, but patient decided that he did not want to wait to have any testing, left AGAINST MEDICAL ADVICE.  I personally performed the services described in this documentation, which was scribed in my presence. The recorded information has been reviewed and is accurate.     Gilda Crease, MD 02/07/15 2159

## 2017-07-04 ENCOUNTER — Encounter (HOSPITAL_COMMUNITY): Payer: Self-pay

## 2017-07-04 ENCOUNTER — Emergency Department (HOSPITAL_COMMUNITY)
Admission: EM | Admit: 2017-07-04 | Discharge: 2017-07-04 | Disposition: A | Discharge status: Home / Self Care | Payer: Medicaid Other | Attending: Emergency Medicine | Admitting: Emergency Medicine

## 2017-07-04 ENCOUNTER — Other Ambulatory Visit: Payer: Self-pay

## 2017-07-04 DIAGNOSIS — L089 Local infection of the skin and subcutaneous tissue, unspecified: Secondary | ICD-10-CM | POA: Diagnosis not present

## 2017-07-04 DIAGNOSIS — F17210 Nicotine dependence, cigarettes, uncomplicated: Secondary | ICD-10-CM | POA: Insufficient documentation

## 2017-07-04 DIAGNOSIS — R590 Localized enlarged lymph nodes: Secondary | ICD-10-CM

## 2017-07-04 MED ORDER — IBUPROFEN 600 MG PO TABS: 600.0000 mg | ORAL_TABLET | Freq: Four times a day (QID) | ORAL | 0 refills | Status: DC | PRN

## 2017-07-04 MED ORDER — IBUPROFEN 800 MG PO TABS
800.0000 mg | ORAL_TABLET | Freq: Once | ORAL | Status: AC
  Administered 2017-07-04: 800 mg via ORAL
  Filled 2017-07-04: qty 1

## 2017-07-04 MED ORDER — CEPHALEXIN 500 MG PO CAPS: 500.0000 mg | ORAL_CAPSULE | Freq: Four times a day (QID) | ORAL | 0 refills | Status: DC

## 2017-07-04 MED ORDER — CEPHALEXIN 500 MG PO CAPS
500.0000 mg | ORAL_CAPSULE | Freq: Once | ORAL | Status: AC
  Administered 2017-07-04: 500 mg via ORAL
  Filled 2017-07-04: qty 1

## 2017-07-04 NOTE — ED Triage Notes (Signed)
Pt has abscess to right upper arm that started a week ago. Pt states it hurts to the touch. No drainage noted.

## 2017-07-04 NOTE — ED Provider Notes (Signed)
[facility] Provider Note   CSN: [id number] Arrival date & time: 07/04/17  1802     History   Chief Complaint Chief Complaint  Patient presents with  . Abscess    HPI Valentino HueGary R Neumeister is a 27 y.o. male presenting with an approximate one week history of a tender nodule at his right medial elbow which started shortly after developing a painful red and swollen right long distal fingertip.  He denies any obvious injury to the finger, works in [employer] and speculates he may have been bit by a spider, but did not witness such an event.  There has been no drainage from the finger and no red streaking, denies pain in the distal forearm, hand or wrist but is starting to develop soreness around the swollen site at the elbow. He denies fever, chills or other complaints.  Denies any other swollen locations. Has had no treatment prior to arrival.  The history is provided by the patient.    Past Medical History:  Diagnosis Date  . Anxiety   . Genital herpes   . Panic attack     There are no active problems to display for this patient.   History reviewed. No pertinent surgical history.     Home Medications    Prior to Admission medications   Not on File    Family History No family history on file.  Social History Social History   Tobacco Use  . Smoking status: Current Every Day Smoker    Packs/day: 1.00    Types: Cigarettes  . Smokeless tobacco: Never Used  Substance Use Topics  . Alcohol use: Yes    Comment: occationally  . Drug use: No     Allergies   Hydrocodone and Vistaril [hydroxyzine hcl]   Review of Systems Review of Systems  Constitutional: Negative for chills and fever.  HENT: Negative for congestion and sore throat.   Eyes: Negative.   Respiratory: Negative for chest tightness and shortness of breath.   Cardiovascular: Negative for chest pain.  Gastrointestinal: Negative for abdominal pain, nausea and vomiting.    Genitourinary: Negative.   Musculoskeletal: Negative for arthralgias, joint swelling and neck pain.  Skin: Positive for color change and wound. Negative for rash.  Neurological: Negative for dizziness, weakness, light-headedness, numbness and headaches.  Psychiatric/Behavioral: Negative.      Physical Exam Updated Vital Signs BP (!) 130/96 (BP Location: Right Arm)   Pulse (!) 110   Temp 98.6 F (37 C) (Oral)   Resp 16   Ht 5\' 9"  (1.753 m)   Wt 95.3 kg (210 lb)   SpO2 96%   BMI 31.01 kg/m   Physical Exam  Constitutional: He appears well-developed and well-nourished. No distress.  HENT:  Head: Normocephalic and atraumatic.  Neck: Normal range of motion. Neck supple.  Cardiovascular: Normal rate.  Pulmonary/Chest: Effort normal. He has no wheezes.  Musculoskeletal: Normal range of motion. He exhibits no edema.  Lymphadenopathy:    He has no cervical adenopathy.    He has no axillary adenopathy.       Right: Epitrochlear adenopathy present.  Right epitrochlear lymphadenitis present. No red streaking.  Forearm is soft, nontender.  Skin: There is erythema.  Localized edema and erythema of right long finger proximal cuticle edge, no induration or drainage, no punctures, ulcers or other loss of skin integrity. The cuticle is lifted from the nail plate. No abscess.     ED Treatments / Results  Labs (all labs ordered  are listed, but only abnormal results are displayed) Labs Reviewed - No data to display  EKG  EKG Interpretation None       Radiology No results found.  Procedures Procedures (including critical care time)  Medications Ordered in ED Medications  cephALEXin (KEFLEX) capsule 500 mg (not administered)  ibuprofen (ADVIL,MOTRIN) tablet 800 mg (not administered)     Initial Impression / Assessment and Plan / ED Course  I have reviewed the triage vital signs and the nursing notes.  Pertinent labs & imaging results that were available during my care of  the patient were reviewed by me and considered in my medical decision making (see chart for details).     Pt with right long finger infection without abscess and suspected inflammatory epitrochlear node of right arm.  Discussed warm compresses to the finger and the lymph node site, abx started, anti inflammatory pain medicine. Discussed recheck of these site by pcp or recheck here if not improved with course of abx or for any worsened sx, pain, edema, erythema, fever.  Discussed he may need further tests if the lymph node does not improve with this tx. Pt understands and agrees with plan and close f/u.  Final Clinical Impressions(s) / ED Diagnoses   Final diagnoses:  Finger infection  Epitrochlear lymphadenopathy    ED Discharge Orders    None       Victoriano Lain 07/05/17 1247    Doug Sou, MD 07/05/17 [phone]

## 2017-07-04 NOTE — Discharge Instructions (Signed)
Take your entire course of the antibiotics prescribed.  Ibuprofen as prescribed can also help you with swelling and pain.  Warm compresses or warm water soaks to your finger and also the site of your swollen lymph node several times daily can help with pain and swelling.  Avoid frequent squeezing or rubbing of the lymph node site at this action can keep the lymph node swollen and tender.  As discussed if the lymph node remains swollen or tender after the antibiotics are completed you should have the site rechecked.

## 2017-07-14 ENCOUNTER — Emergency Department (HOSPITAL_COMMUNITY): Payer: Medicaid Other

## 2017-07-14 ENCOUNTER — Emergency Department (HOSPITAL_COMMUNITY)
Admission: EM | Admit: 2017-07-14 | Discharge: 2017-07-14 | Disposition: A | Discharge status: Home / Self Care | Payer: Medicaid Other | Attending: Emergency Medicine | Admitting: Emergency Medicine

## 2017-07-14 ENCOUNTER — Other Ambulatory Visit: Payer: Self-pay

## 2017-07-14 ENCOUNTER — Encounter (HOSPITAL_COMMUNITY): Payer: Self-pay | Admitting: Emergency Medicine

## 2017-07-14 DIAGNOSIS — F17210 Nicotine dependence, cigarettes, uncomplicated: Secondary | ICD-10-CM | POA: Diagnosis not present

## 2017-07-14 DIAGNOSIS — L049 Acute lymphadenitis, unspecified: Secondary | ICD-10-CM

## 2017-07-14 DIAGNOSIS — M79621 Pain in right upper arm: Secondary | ICD-10-CM | POA: Diagnosis present

## 2017-07-14 DIAGNOSIS — L042 Acute lymphadenitis of upper limb: Secondary | ICD-10-CM | POA: Diagnosis not present

## 2017-07-14 DIAGNOSIS — R52 Pain, unspecified: Secondary | ICD-10-CM

## 2017-07-14 MED ORDER — IBUPROFEN 400 MG PO TABS
400.0000 mg | ORAL_TABLET | Freq: Once | ORAL | Status: AC
  Administered 2017-07-14: 400 mg via ORAL
  Filled 2017-07-14: qty 1

## 2017-07-14 MED ORDER — SULFAMETHOXAZOLE-TRIMETHOPRIM 800-160 MG PO TABS: 1.0000 | ORAL_TABLET | Freq: Two times a day (BID) | ORAL | 0 refills | Status: AC

## 2017-07-14 MED ORDER — GADOBENATE DIMEGLUMINE 529 MG/ML IV SOLN
20.0000 mL | Freq: Once | INTRAVENOUS | Status: AC | PRN
  Administered 2017-07-14: 20 mL via INTRAVENOUS

## 2017-07-14 MED ORDER — IBUPROFEN 800 MG PO TABS: 800.0000 mg | ORAL_TABLET | Freq: Three times a day (TID) | ORAL | 0 refills | Status: DC

## 2017-07-14 NOTE — Discharge Instructions (Signed)
Your mri today suggests that this is an infected lymph node as we originally thought, but because they were unable to complete the mri using contrast, we cannot definitely say this is not a sarcoma as discussed.  Start the new antibiotic prescribed and call Dr. Lovell SheehanJenkins for a recheck of this site as soon as possible.

## 2017-07-14 NOTE — ED Provider Notes (Signed)
[facility] Provider [facility]   CSN: [id number] Arrival date & time: 07/14/17  [id number]     History   Chief Complaint Chief Complaint  Patient presents with  . Abscess    HPI Scott Huber is a 27 y.o. male who was seen here on 2/25 for a finger infection with a reactive right epitrochlear lymph node.  He was treated with a 10 day course of keflex and ibuprofen and reports the finger is improved but has developed worsened pain and swelling with redness at the site of the lymph node.  He works in [employer] and has been unable to work with this painful swelling.  He denies fevers or chills.  His pain radiates into the right axilla.  He is taking ibuprofen which has offered some relief.  Warm compresses also used with equivocal improvement.  The history is provided by the patient.    Past Medical History:  Diagnosis Date  . Anxiety   . Genital herpes   . Panic attack     There are no active problems to display for this patient.   History reviewed. No pertinent surgical history.     Home Medications    Prior to Admission medications   Medication Sig Start Date End Date Taking? Authorizing Provider  cephALEXin (KEFLEX) 500 MG capsule Take 1 capsule (500 mg total) by mouth 4 (four) times daily. Patient not taking: Reported on 07/14/2017 07/04/17   Burgess AmorIdol, Charnette Younkin, PA-C  ibuprofen (ADVIL,MOTRIN) 800 MG tablet Take 1 tablet (800 mg total) by mouth 3 (three) times daily. 07/14/17   Burgess AmorIdol, Stone Spirito, PA-C  sulfamethoxazole-trimethoprim (BACTRIM DS,SEPTRA DS) 800-160 MG tablet Take 1 tablet by mouth 2 (two) times daily for 10 days. 07/14/17 07/24/17  Burgess AmorIdol, Bobbye Petti, PA-C    Family History History reviewed. No pertinent family history.  Social History Social History   Tobacco Use  . Smoking status: Current Every Day Smoker    Packs/day: 1.00    Types: Cigarettes  . Smokeless tobacco: Never Used  Substance Use Topics  . Alcohol use: Yes    Comment: occationally  . Drug  use: No     Allergies   Hydrocodone and Vistaril [hydroxyzine hcl]   Review of Systems Review of Systems  Constitutional: Negative for fever.  HENT: Negative.   Eyes: Negative.   Respiratory: Negative for shortness of breath.   Cardiovascular: Negative for chest pain.  Gastrointestinal: Negative for abdominal pain and nausea.  Genitourinary: Negative.   Musculoskeletal: Positive for arthralgias. Negative for joint swelling and neck pain.  Skin: Positive for color change. Negative for rash and wound.  Neurological: Negative for dizziness, weakness, light-headedness, numbness and headaches.  Psychiatric/Behavioral: Negative.      Physical Exam Updated Vital Signs BP 129/89   Pulse 61   Temp 97.8 F (36.6 C) (Oral)   Resp 16   Ht 5\' 9"  (1.753 m)   Wt 95.3 kg (210 lb)   SpO2 100%   BMI 31.01 kg/m   Physical Exam  Constitutional: He appears well-developed and well-nourished. No distress.  HENT:  Head: Normocephalic.  Neck: Neck supple.  Cardiovascular: Normal rate.  Pulmonary/Chest: Effort normal. He has no wheezes.  Musculoskeletal: Normal range of motion. He exhibits no edema.  Skin: Skin is warm. No rash noted. There is erythema.  Firm nodule right medial elbow/lower humerus which is tender. Mild erythema without red streaking.  TTP right axilla without any palpable enlarged nodes. No pain, erythema or edema of forearm, wrist, hand  or fingers.     ED Treatments / Results  Labs (all labs ordered are listed, but only abnormal results are displayed) Labs Reviewed - No data to display  EKG  EKG Interpretation None       Radiology Mr Elbow Right Wo Contrast  Result Date: 07/14/2017 EXAM: MRI OF THE RIGHT ELBOW WITHOUT CONTRAST TECHNIQUE: Multiplanar, multisequence MR imaging of the elbow was performed. No intravenous contrast was administered. COMPARISON:  07/14/2017 ultrasound elbow FINDINGS: TENDONS Common forearm flexor origin: Intact. Common forearm  extensor origin: Intact. Biceps: Intact. Triceps: Intact. LIGAMENTS Medial stabilizers: Intact. Lateral stabilizers:  Intact. Joint: No joint effusion. Cubital tunnel: Normal cubital tunnel. Bones: No acute osseous abnormality.  No aggressive osseous lesion. Soft tissue: Muscles are normal. 2.9 x 2.6 x 2.8 cm T2 mildly hyperintense and T1 intermediate signal mass in the medial aspect of the distal humerus adjacent to the neurovascular bundle. Surrounding inflammatory changes and edema in the adjacent subcutaneous fat. No other solid or cystic mass. IMPRESSION: 1. 2.9 x 2.6 x 2.8 cm in the medial aspect of the distal humerus adjacent to the neurovascular bundle concerning for enlarged lymph node likely secondary to an infectious or inflammatory etiology. Malignancy is not completely excluded. Recommend close clinical follow-up. In the absence of complete short-term interval resolution, a follow-up MRI of right elbow without and with intravenous contrast is recommended. Electronically Signed   By: Elige Ko   On: 07/14/2017 14:19   [place] Rt Upper Extrem Ltd Soft Tissue Non Vascular  Result Date: 07/14/2017 CLINICAL DATA:  Right medial elbow mass. EXAM: ULTRASOUND RIGHT UPPER EXTREMITY LIMITED TECHNIQUE: Ultrasound examination of the upper extremity soft tissues was performed in the area of clinical concern. COMPARISON:  None FINDINGS: 3.1 x 2.4 x 2.5 cm well-marginated hypoechoic mass with internal Doppler flow along the medial aspect of the elbow. Increased through transmission. No surrounding soft tissue abnormality. No fluid collection or hematoma. IMPRESSION: 3.1 x 2.4 x 2.5 cm indeterminate hypoechoic soft tissue mass along the medial aspect of the elbow. This may reflect an abnormal enlarged lymph node versus a soft tissue sarcoma. Recommend further characterization with an MRI of the right elbow without and with intravenous contrast. Electronically Signed   By: Elige Ko   On: 07/14/2017 12:29     Procedures Procedures (including critical care time)  Medications Ordered in ED Medications  ibuprofen (ADVIL,MOTRIN) tablet 400 mg (400 mg Oral Given 07/14/17 1002)  gadobenate dimeglumine (MULTIHANCE) injection 20 mL (20 mLs Intravenous Contrast Given 07/14/17 1351)     Initial Impression / Assessment and Plan / ED Course  I have reviewed the triage vital signs and the nursing notes.  Pertinent labs & imaging results that were available during my care of the patient were reviewed by me and considered in my medical decision making (see chart for details).     Discussed pt with Dr. Lovell Sheehan who agrees to see pt in office for f/u, but recommends US of the site first.  Also recommends adding bactrim for broader coverage assuming infected lymph node. [place] recommending MRI which was an incomplete study, unable to perform with contrast due to body habitus and ability to tolerate MRI. Favors infection/inflammation, unable to completely r/o malignancy. Discussed with pt who agrees with close f/u with surgery.   Final Clinical Impressions(s) / ED Diagnoses   Final diagnoses:  Lymphadenitis, acute    ED Discharge Orders        Ordered    sulfamethoxazole-trimethoprim (BACTRIM DS,SEPTRA DS)  800-160 MG tablet  2 times daily     07/14/17 1456    ibuprofen (ADVIL,MOTRIN) 800 MG tablet  3 times daily     07/14/17 1456       Burgess Amor, PA-C 07/14/17 1504    Donnetta Hutching, MD 07/15/17 [phone]

## 2017-07-14 NOTE — ED Triage Notes (Signed)
Patient c/o "knot" to right upper arm. Per patient x2 weeks red and tender to touch. Patient seen here in ER and diagnosed with abscess and given ibuprofen and antibiotics. Patient denies any improvement. Denies any fevers, nausea, vomiting, or drainage.

## 2017-07-26 ENCOUNTER — Ambulatory Visit (INDEPENDENT_AMBULATORY_CARE_PROVIDER_SITE_OTHER): Payer: Medicaid Other | Admitting: General Surgery

## 2017-07-26 ENCOUNTER — Encounter: Payer: Self-pay | Admitting: General Surgery

## 2017-07-26 VITALS — BP 141/86 | HR 83 | Temp 97.8°F | Ht 70.0 in | Wt 211.0 lb

## 2017-07-26 DIAGNOSIS — L02413 Cutaneous abscess of right upper limb: Secondary | ICD-10-CM | POA: Diagnosis not present

## 2017-07-26 NOTE — Progress Notes (Signed)
Scott Huber; [id number]; 03/03/1991   HPI Patient is a 27 year old white male who was referred to my care by the ER for evaluation and treatment of an abscess on his right arm.  He was originally seen in the emergency room on 07/14/2017.  At that time, he had an enlarged subcutaneous indurated mass.  There was also question of whether this was lymphadenopathy.  He denies any trauma to the right arm, though his right middle finger had evidence of cellulitis.  This was at the nail.  He states he has not had these occur in the past.  He was started on antibiotics.  Approximately 3-4 days ago, he had spontaneous rupture of the abscess with purulent fluid draining.  He has been keeping clean with soap and water.  He states he has not been taking the antibiotic for a few days, though he still has some left.  He currently has 0 out of 10 pain.  He has noticed the mass is resolving.  Patient denies any IV drug use. Past Medical History:  Diagnosis Date  . Anxiety   . Genital herpes   . Panic attack     History reviewed. No pertinent surgical history.  History reviewed. No pertinent family history.  Current Outpatient Medications on File Prior to Visit  Medication Sig Dispense Refill  . cephALEXin (KEFLEX) 500 MG capsule Take 1 capsule (500 mg total) by mouth 4 (four) times daily. (Patient not taking: Reported on 07/14/2017) 40 capsule 0  . ibuprofen (ADVIL,MOTRIN) 800 MG tablet Take 1 tablet (800 mg total) by mouth 3 (three) times daily. 21 tablet 0   No current facility-administered medications on file prior to visit.     Allergies  Allergen Reactions  . Hydrocodone Other (See Comments)    abd pain  . Vistaril [Hydroxyzine Hcl] Nausea Only    Social History   Substance and Sexual Activity  Alcohol Use Yes   Comment: occationally    Social History   Tobacco Use  Smoking Status Current Every Day Smoker  . Packs/day: 1.00  . Types: Cigarettes  Smokeless Tobacco Never Used    Review  of Systems  Constitutional: Negative.   HENT: Negative.   Eyes: Negative.   Respiratory: Negative.   Cardiovascular: Negative.   Gastrointestinal: Negative.   Genitourinary: Negative.   Musculoskeletal: Negative.   Skin: Negative.   Neurological: Negative.   Endo/Heme/Allergies: Negative.   Psychiatric/Behavioral: Negative.     Objective   Vitals:   07/26/17 1449  BP: (!) 141/86  Pulse: 83  Temp: 97.8 F (36.6 C)    Physical Exam  Constitutional: He is oriented to person, place, and time and well-developed, well-nourished, and in no distress.  HENT:  Head: Normocephalic and atraumatic.  Cardiovascular: Normal rate, regular rhythm and normal heart sounds. Exam reveals no gallop and no friction rub.  No murmur heard. Pulmonary/Chest: Effort normal and breath sounds normal. No respiratory distress. He has no wheezes. He has no rales.  Neurological: He is alert and oriented to person, place, and time.  Skin:  Right arm with indurated, nonfluctuant, erythematous subcutaneous nodule present, approximately 1.5 cm in size.  Patient states this is decreased in size.  Vitals reviewed.   Assessment  Abscess, right arm, resolving Plan   Patient was instructed to keep the wound clean and dry.  He should finish his antibiotic course.  He will follow-up in 2 weeks should it not resolve.  No need for acute surgical intervention at this time.

## 2017-08-09 ENCOUNTER — Ambulatory Visit: Payer: Medicaid Other | Admitting: General Surgery

## 2017-12-07 ENCOUNTER — Other Ambulatory Visit: Payer: Self-pay

## 2017-12-07 ENCOUNTER — Emergency Department (HOSPITAL_COMMUNITY)
Admission: EM | Admit: 2017-12-07 | Discharge: 2017-12-07 | Disposition: A | Discharge status: Home / Self Care | Payer: Medicaid Other | Attending: Emergency Medicine | Admitting: Emergency Medicine

## 2017-12-07 ENCOUNTER — Encounter (HOSPITAL_COMMUNITY): Payer: Self-pay | Admitting: *Deleted

## 2017-12-07 DIAGNOSIS — F1910 Other psychoactive substance abuse, uncomplicated: Secondary | ICD-10-CM

## 2017-12-07 DIAGNOSIS — R443 Hallucinations, unspecified: Secondary | ICD-10-CM | POA: Diagnosis present

## 2017-12-07 DIAGNOSIS — F1520 Other stimulant dependence, uncomplicated: Secondary | ICD-10-CM | POA: Diagnosis not present

## 2017-12-07 DIAGNOSIS — F14288 Cocaine dependence with other cocaine-induced disorder: Secondary | ICD-10-CM | POA: Insufficient documentation

## 2017-12-07 DIAGNOSIS — F12259 Cannabis dependence with psychotic disorder, unspecified: Secondary | ICD-10-CM | POA: Insufficient documentation

## 2017-12-07 DIAGNOSIS — F3010 Manic episode without psychotic symptoms, unspecified: Secondary | ICD-10-CM

## 2017-12-07 DIAGNOSIS — F17210 Nicotine dependence, cigarettes, uncomplicated: Secondary | ICD-10-CM | POA: Insufficient documentation

## 2017-12-07 LAB — RAPID URINE DRUG SCREEN, HOSP PERFORMED
Amphetamines: POSITIVE — AB
BARBITURATES: NOT DETECTED
Benzodiazepines: NOT DETECTED
COCAINE: NOT DETECTED
Opiates: NOT DETECTED
Tetrahydrocannabinol: POSITIVE — AB

## 2017-12-07 LAB — CBC WITH DIFFERENTIAL/PLATELET
BASOS ABS: 0 10*3/uL (ref 0.0–0.1)
Basophils Relative: 0 %
Eosinophils Absolute: 0.2 10*3/uL (ref 0.0–0.7)
Eosinophils Relative: 2 %
HEMATOCRIT: 44.3 % (ref 39.0–52.0)
Hemoglobin: 15.9 g/dL (ref 13.0–17.0)
LYMPHS ABS: 1.6 10*3/uL (ref 0.7–4.0)
LYMPHS PCT: 15 %
MCH: 32 pg (ref 26.0–34.0)
MCHC: 35.9 g/dL (ref 30.0–36.0)
MCV: 89.1 fL (ref 78.0–100.0)
MONO ABS: 1 10*3/uL (ref 0.1–1.0)
Monocytes Relative: 10 %
NEUTROS ABS: 7.5 10*3/uL (ref 1.7–7.7)
Neutrophils Relative %: 73 %
Platelets: 325 10*3/uL (ref 150–400)
RBC: 4.97 MIL/uL (ref 4.22–5.81)
RDW: 12.3 % (ref 11.5–15.5)
WBC: 10.4 10*3/uL (ref 4.0–10.5)

## 2017-12-07 LAB — COMPREHENSIVE METABOLIC PANEL
ALBUMIN: 4.9 g/dL (ref 3.5–5.0)
ALK PHOS: 47 U/L (ref 38–126)
ALT: 21 U/L (ref 0–44)
ANION GAP: 9 (ref 5–15)
AST: 17 U/L (ref 15–41)
BILIRUBIN TOTAL: 1 mg/dL (ref 0.3–1.2)
BUN: 9 mg/dL (ref 6–20)
CALCIUM: 9.1 mg/dL (ref 8.9–10.3)
CO2: 28 mmol/L (ref 22–32)
Chloride: 99 mmol/L (ref 98–111)
Creatinine, Ser: 1.02 mg/dL (ref 0.61–1.24)
GLUCOSE: 78 mg/dL (ref 70–99)
Potassium: 3 mmol/L — ABNORMAL LOW (ref 3.5–5.1)
Sodium: 136 mmol/L (ref 135–145)
TOTAL PROTEIN: 7.9 g/dL (ref 6.5–8.1)

## 2017-12-07 LAB — ETHANOL

## 2017-12-07 NOTE — Discharge Instructions (Addendum)
Please use the provided resources to obtain assistance with outpatient follow-up.

## 2017-12-07 NOTE — ED Triage Notes (Signed)
Pt brought in by [facility] for c/o hallucinations; pt has been using cocaine tonight and became combative with law enforcement using a knife; pt states he has been up with no sleep x 2 days

## 2017-12-07 NOTE — BH Assessment (Signed)
Tele Assessment Note   Patient Name: Scott Huber MRN: [id number] Referring Physician: Gerhard Munch, MD Location of Patient:  [facility] Location of Provider: Behavioral Health TTS Department  Scott Huber is an 27 y.o. male come to the ER after he reports that his mother call 911. Patient presents with hallucinations triggered by substance use. Patient admits he has been abusing cocaine, meth, and THC. Report, "I abuse the HELL out of cocaine. I like getting high." Patient report he was paranoid, seeing shadows and has experienced auditory hallucinations while under the insurance. Patient denies suicidal / homicidal ideations, and denies visual hallucinations. Patient states he is no longer having hallucinations and is not paranoid.   Patient was oriented x4 and was calm and pleasant during the assessment. Patient report he wants assistance for substance use. Patient is not responding to internal or external stimuli or delusional thoughts.   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 Anxiety   . Genital herpes   . Panic attack     History reviewed. No pertinent surgical history.  Family History: History reviewed. No pertinent family history.  Social History:  reports that he has been smoking cigarettes.  He has been smoking about 1.00 pack per day. He has never used smokeless tobacco. He reports that he drinks alcohol. He reports that he has current or past drug history. Drugs: Cocaine, Marijuana, and Methamphetamines.  Additional Social History:  Alcohol / Drug Use Pain Medications: see MAR Prescriptions: see MAR Over the  Counter: see MAR History of alcohol / drug use?: Yes Longest period of sobriety (when/how long): n/a Substance #1 Name of Substance 1: cocaine 1 - Age of First Use: unknown 1 - Amount (size/oz): unknown 1 - Frequency: unknown 1 - Duration: unknown 1 - Last Use / Amount: 12/06/2017 Substance #2 Name of Substance 2: THC 2 - Age of First Use: unknown 2 - Amount (size/oz): unknown 2 - Frequency: unknown 2 - Duration: unknown 2 - Last Use / Amount: 12/06/2017 Substance #3 Name of Substance 3: Meth 3 - Age of First Use: unknown 3 - Amount (size/oz): unknown 3 - Frequency: unknown 3 - Duration: unknown 3 - Last Use / Amount: 12/06/2017  CIWA: CIWA-Ar BP: (!) 133/95 Pulse Rate: (!) 113 COWS:    Allergies:  Allergies  Allergen Reactions  . Hydrocodone Other (See Comments)    abd pain  . Vistaril [Hydroxyzine Hcl] Nausea Only    Home Medications:  (Not in a hospital admission)  OB/GYN Status:  No LMP for male patient.  General Assessment Data Location of Assessment: [facility] TTS Assessment: In system Is this a Tele or Face-to-Face Assessment?: Tele Assessment Is this an Initial Assessment or a Re-assessment for this encounter?: Initial Assessment Marital status: Single Living Arrangements: Other (Comment)(pt report he lives many different places) Can pt return to current living arrangement?: Yes Admission Status: Voluntary Is patient capable of signing voluntary admission?: Yes Referral Source: Self/Family/Friend Insurance type: Medicaid     Crisis Care Plan Living Arrangements: Other (Comment)(pt report he lives many different places) Legal Guardian: Other:(self) Name of Psychiatrist: pt denies Name of Therapist: pt denies  Education Status Is patient currently in school?:  No Is the patient employed, unemployed or receiving disability?: Unemployed  Risk to self with the past 6 months Suicidal Ideation: No Has patient been a risk to self within the past 6 months  prior to admission? : No Suicidal Intent: No Has patient had any suicidal intent within the past 6 months prior to admission? : No Is patient at risk for suicide?: No Suicidal Plan?: No Has patient had any suicidal plan within the past 6 months prior to admission? : No Access to Means: No What has been your use of drugs/alcohol within the last 12 months?: meth, cocaine, addrell Previous Attempts/Gestures: No How many times?: 1(pt report years ago attempted to hang self ) Other Self Harm Risks: substance use Triggers for Past Attempts: Other (Comment) Intentional Self Injurious Behavior: None Family Suicide History: No Recent stressful life event(s): Other (Comment)(multiple life stressors, provided no detail ) Persecutory voices/beliefs?: No Depression: No Depression Symptoms: (patient denies) Substance abuse history and/or treatment for substance abuse?: No Suicide prevention information given to non-admitted patients: Not applicable  Risk to Others within the past 6 months Homicidal Ideation: No Does patient have any lifetime risk of violence toward others beyond the six months prior to admission? : No Thoughts of Harm to Others: No Current Homicidal Intent: No Current Homicidal Plan: No Access to Homicidal Means: No Identified Victim: n/a History of harm to others?: No Assessment of Violence: None Noted Violent Behavior Description: None noted  Does patient have access to weapons?: No Criminal Charges Pending?: No Does patient have a court date: No Is patient on probation?: No  Psychosis Hallucinations: Auditory, Visual(auditory / visual hallucinations triggered by substance use) Delusions: None noted  Mental Status Report Appearance/Hygiene: Unremarkable Eye Contact: Good Motor Activity: Restlessness Speech: Logical/coherent Level of Consciousness: Alert Mood: Preoccupied, Pleasant Affect: Appropriate to circumstance Anxiety Level: None Thought Processes:  Coherent, Relevant Judgement: Unimpaired Orientation: Person, Place, Time, Situation Obsessive Compulsive Thoughts/Behaviors: None  Cognitive Functioning Concentration: Normal Memory: Recent Intact, Remote Intact Is patient IDD: No Is patient DD?: No Sleep: Decreased Total Hours of Sleep: (Report has not slept in 2 days) Vegetative Symptoms: None  ADLScreening [facility] Assessment Services) Patient's cognitive ability adequate to safely complete daily activities?: Yes Patient able to express need for assistance with ADLs?: Yes Independently performs ADLs?: Yes (appropriate for developmental age)  Prior Inpatient Therapy Prior Inpatient Therapy: No  Prior Outpatient Therapy Prior Outpatient Therapy: No Does patient have an ACCT team?: No Does patient have Intensive In-House Services?  : No Does patient have [facility] services? : No Does patient have P4CC services?: No  ADL Screening (condition at time of admission) Patient's cognitive ability adequate to safely complete daily activities?: Yes Is the patient deaf or have difficulty hearing?: No Does the patient have difficulty seeing, even when wearing glasses/contacts?: No Does the patient have difficulty concentrating, remembering, or making decisions?: No Patient able to express need for assistance with ADLs?: Yes Does the patient have difficulty dressing or bathing?: No Independently performs ADLs?: Yes (appropriate for developmental age) Does the patient have difficulty walking or climbing stairs?: No       Abuse/Neglect Assessment (Assessment to be complete while patient is alone) Abuse/Neglect Assessment Can Be Completed: Yes Physical Abuse: Denies Verbal Abuse: Denies Sexual Abuse: Denies Exploitation of patient/patient's resources: Denies Self-Neglect: Denies     Merchant navy officer (For Healthcare) Does Patient Have a Medical Advance Directive?: No Would patient like information on creating a medical advance  directive?: No - Patient declined  Disposition:  Disposition Initial Assessment Completed for this Encounter: Yes(Shuvon Rankin, NP, pt does not meet inpatient hospitalizatio)   Oluwademilade Kellett 12/07/2017 9:40 AM

## 2017-12-07 NOTE — ED Provider Notes (Signed)
[facility] EMERGENCY DEPARTMENT Provider Note   CSN: [id number] Arrival date & time: 12/07/17  4098     History   Chief Complaint Chief Complaint  Patient presents with  . Hallucinations    HPI Scott Huber is a 27 y.o. male.  HPI Patient presents with concern for racing thoughts, ongoing cocaine abuse. Reportedly the patient was brought in by police after being found at his mother's house after being awake for several days, with sustained verbal output, manic behavior. Reported the patient was offered the choice of coming to the hospital for evaluation, or going to jail for outstanding warrant. The patient himself states that he has been using cocaine frequently, has been awake for several days, has ongoing racing thoughts, paranoia, denies hallucination, denies suicidal thoughts, denies pain. He denies other illicit substance use, but states that during his most recent cocaine binge he may have had an adulterated portion, with specific concern for heroine. He also acknowledges alcohol use, denies other drug use including crack, meth, heroin.  He was recently incarcerated.  Past Medical History:  Diagnosis Date  . Anxiety   . Genital herpes   . Panic attack     There are no active problems to display for this patient.   History reviewed. No pertinent surgical history.      Home Medications    Prior to Admission medications   Medication Sig Start Date End Date Taking? Authorizing Provider  cephALEXin (KEFLEX) 500 MG capsule Take 1 capsule (500 mg total) by mouth 4 (four) times daily. Patient not taking: Reported on 07/14/2017 07/04/17   Burgess Amor, PA-C  ibuprofen (ADVIL,MOTRIN) 800 MG tablet Take 1 tablet (800 mg total) by mouth 3 (three) times daily. 07/14/17   Burgess Amor, PA-C    Family History History reviewed. No pertinent family history.  Social History Social History   Tobacco Use  . Smoking status: Current Every Day Smoker    Packs/day: 1.00   Types: Cigarettes  . Smokeless tobacco: Never Used  Substance Use Topics  . Alcohol use: Yes    Comment: occationally  . Drug use: Yes    Types: Cocaine, Marijuana, Methamphetamines     Allergies   Hydrocodone and Vistaril [hydroxyzine hcl]   Review of Systems Review of Systems  Constitutional:       Per HPI, otherwise negative  HENT:       Per HPI, otherwise negative  Respiratory:       Per HPI, otherwise negative  Cardiovascular:       Per HPI, otherwise negative  Gastrointestinal: Negative for vomiting.  Endocrine:       Negative aside from HPI  Genitourinary:       Neg aside from HPI   Musculoskeletal:       Per HPI, otherwise negative  Skin: Negative.   Neurological: Negative for syncope.  Psychiatric/Behavioral: Positive for dysphoric mood and sleep disturbance. The patient is nervous/anxious and is hyperactive.      Physical Exam Updated Vital Signs BP (!) 133/95 (BP Location: Right Arm)   Pulse (!) 113   Temp 97.7 F (36.5 C) (Oral)   Resp 18   Ht 5\' 9"  (1.753 m)   Wt 86.2 kg (190 lb)   SpO2 99%   BMI 28.06 kg/m   Physical Exam  Constitutional: He is oriented to person, place, and time. He appears well-developed. No distress.  HENT:  Head: Normocephalic and atraumatic.  Eyes: Conjunctivae and EOM are normal.  Cardiovascular: Normal rate  and regular rhythm.  Pulmonary/Chest: Effort normal. No stridor. No respiratory distress.  Abdominal: He exhibits no distension.  Musculoskeletal: He exhibits no edema.  Neurological: He is alert and oriented to person, place, and time.  Skin: Skin is warm and dry.  Psychiatric: His mood appears anxious. His affect is labile. He is hyperactive. Thought content is paranoid. He is inattentive.  Nursing note and vitals reviewed.    ED Treatments / Results  Labs (all labs ordered are listed, but only abnormal results are displayed) Labs Reviewed  COMPREHENSIVE METABOLIC PANEL - Abnormal; Notable for the  following components:      Result Value   Potassium 3.0 (*)    All other components within normal limits  RAPID URINE DRUG SCREEN, HOSP PERFORMED - Abnormal; Notable for the following components:   Amphetamines POSITIVE (*)    Tetrahydrocannabinol POSITIVE (*)    All other components within normal limits  ETHANOL  CBC WITH DIFFERENTIAL/PLATELET   Procedures Procedures (including critical care time)  Medications Ordered in ED Medications - No data to display   Initial Impression / Assessment and Plan / ED Course  I have reviewed the triage vital signs and the nursing notes.  Pertinent labs & imaging results that were available during my care of the patient were reviewed by me and considered in my medical decision making (see chart for details).  Young male with acknowledged cocaine dependency presents due to racing thoughts, sleeplessness, exhibition of manic behavior, thought pattern, though no overt suicidal ideation. Patient is disenfranchised, socially isolated, has history of bipolar disorder, unclear living circumstances, and given his presentation with concern for paranoia, manic behavior, he was medically cleared for psychiatric evaluation.   10:14 AM Patient has been seen and evaluated by our behavioral health team, feel the patient is appropriate for outpatient mental health services, does not meet criteria for inpatient treatment. Patient provided resources, and absent other notable medical phenomena, patient discharged in stable condition.  Final Clinical Impressions(s) / ED Diagnoses  Polysubstance abuse Manic thoughts   Gerhard MunchLockwood, [place], MD 12/07/17 1014

## 2017-12-07 NOTE — ED Notes (Signed)
TTS in progress at this time.  

## 2017-12-07 NOTE — Consult Note (Signed)
  Tele Assessment   Scott Huber, 27 y.o., male patient presented to [facility] with complaints of hallucinations.  Patient seen via telepsych by TTS and  this provider; chart reviewed and consulted with Dr. Lucianne MussKumar on 12/07/17.  On evaluation Scott Huber reports that he was high when his mother had him sent her.  Patient states that he is interested in getting help with his dug problem "but I like getting high."  Patient states that he is no longer having hallucinations and is not paranoid.  Reports that it occurs when he is high.  Patient also denies suicidal/self-harm/homicidal ideation.   During evaluation Scott Huber is alert/oriented x 4; calm/cooperative with pleasant affect.  He does not appear to be responding to internal/external stimuli or delusional thoughts.  Patient denies suicidal/self-harm/homicidal ideation, psychosis, and paranoia.  Patient answered question appropriately.    For detailed note see TTS tele assessment note.  Recommendations: Resources for community services substance abuse services and list of short/long term rehab facilities  Disposition:  Patient psychiatrically cleared No evidence of imminent risk to self or others at present.   Patient does not meet criteria for psychiatric inpatient admission. Discussed crisis plan, support from social network, calling 911, coming to the Emergency Department, and calling Suicide Hotline.    Spoke with Dr. Jeraldine LootsLockwood; informed of above recommendation and disposition  Shuvon B. Rankin, NP

## 2017-12-07 NOTE — Progress Notes (Signed)
Patient seen and assessed by both TTS Specialist, Dian Situelvondria Dubose, LPC and [facility] Phys[facility] Extender, Assunta FoundShuvon Rankin, NP.  Patient does not meet inpatient criteria for mental health treatment.  Patient does request information for substance abuse/detoc treatment.  Referral information will be sent to [facility].  Patient not eligible for [facility] as he has Medicaid.  Timmothy EulerJean T. Kaylyn LimSutter, MSW, LCSWA Disposition Clinical Social Work [phone] (cell) [phone] (office)

## 2017-12-14 ENCOUNTER — Encounter (HOSPITAL_COMMUNITY): Payer: Self-pay | Admitting: Emergency Medicine

## 2017-12-14 ENCOUNTER — Emergency Department (HOSPITAL_COMMUNITY): Payer: Medicaid Other

## 2017-12-14 ENCOUNTER — Emergency Department (HOSPITAL_COMMUNITY)
Admission: EM | Admit: 2017-12-14 | Discharge: 2017-12-14 | Disposition: A | Discharge status: Home / Self Care | Payer: Medicaid Other | Attending: Emergency Medicine | Admitting: Emergency Medicine

## 2017-12-14 ENCOUNTER — Other Ambulatory Visit: Payer: Self-pay

## 2017-12-14 DIAGNOSIS — S0001XA Abrasion of scalp, initial encounter: Secondary | ICD-10-CM | POA: Insufficient documentation

## 2017-12-14 DIAGNOSIS — F17210 Nicotine dependence, cigarettes, uncomplicated: Secondary | ICD-10-CM | POA: Insufficient documentation

## 2017-12-14 DIAGNOSIS — Z23 Encounter for immunization: Secondary | ICD-10-CM | POA: Insufficient documentation

## 2017-12-14 DIAGNOSIS — Y939 Activity, unspecified: Secondary | ICD-10-CM | POA: Insufficient documentation

## 2017-12-14 DIAGNOSIS — Y999 Unspecified external cause status: Secondary | ICD-10-CM | POA: Diagnosis not present

## 2017-12-14 DIAGNOSIS — S3992XA Unspecified injury of lower back, initial encounter: Secondary | ICD-10-CM | POA: Diagnosis present

## 2017-12-14 DIAGNOSIS — S0990XA Unspecified injury of head, initial encounter: Secondary | ICD-10-CM | POA: Insufficient documentation

## 2017-12-14 DIAGNOSIS — S32010A Wedge compression fracture of first lumbar vertebra, initial encounter for closed fracture: Secondary | ICD-10-CM

## 2017-12-14 DIAGNOSIS — Y92410 Unspecified street and highway as the place of occurrence of the external cause: Secondary | ICD-10-CM | POA: Insufficient documentation

## 2017-12-14 LAB — URINALYSIS, ROUTINE W REFLEX MICROSCOPIC
Bilirubin Urine: NEGATIVE
Glucose, UA: NEGATIVE mg/dL
Hgb urine dipstick: NEGATIVE
Ketones, ur: NEGATIVE mg/dL
LEUKOCYTES UA: NEGATIVE
NITRITE: NEGATIVE
PROTEIN: NEGATIVE mg/dL
SPECIFIC GRAVITY, URINE: 1.012 (ref 1.005–1.030)
pH: 6 (ref 5.0–8.0)

## 2017-12-14 LAB — COMPREHENSIVE METABOLIC PANEL
ALBUMIN: 4.5 g/dL (ref 3.5–5.0)
ALK PHOS: 55 U/L (ref 38–126)
ALT: 23 U/L (ref 0–44)
AST: 28 U/L (ref 15–41)
Anion gap: 10 (ref 5–15)
BILIRUBIN TOTAL: 0.7 mg/dL (ref 0.3–1.2)
BUN: 6 mg/dL (ref 6–20)
CALCIUM: 9 mg/dL (ref 8.9–10.3)
CO2: 22 mmol/L (ref 22–32)
Chloride: 104 mmol/L (ref 98–111)
Creatinine, Ser: 0.86 mg/dL (ref 0.61–1.24)
GFR calc non Af Amer: >60 mL/min (ref >60)
GLUCOSE: 99 mg/dL (ref 70–99)
POTASSIUM: 2.9 mmol/L — AB (ref 3.5–5.1)
SODIUM: 136 mmol/L (ref 135–145)
TOTAL PROTEIN: 7 g/dL (ref 6.5–8.1)

## 2017-12-14 LAB — CBC
HEMATOCRIT: 41.9 % (ref 39.0–52.0)
Hemoglobin: 15.2 g/dL (ref 13.0–17.0)
MCH: 31.5 pg (ref 26.0–34.0)
MCHC: 36.3 g/dL — ABNORMAL HIGH (ref 30.0–36.0)
MCV: 86.7 fL (ref 78.0–100.0)
Platelets: 285 10*3/uL (ref 150–400)
RBC: 4.83 MIL/uL (ref 4.22–5.81)
RDW: 12.3 % (ref 11.5–15.5)
WBC: 8.5 10*3/uL (ref 4.0–10.5)

## 2017-12-14 LAB — RAPID URINE DRUG SCREEN, HOSP PERFORMED
AMPHETAMINES: POSITIVE — AB
BENZODIAZEPINES: NOT DETECTED
Barbiturates: NOT DETECTED
COCAINE: POSITIVE — AB
OPIATES: NOT DETECTED
Tetrahydrocannabinol: NOT DETECTED

## 2017-12-14 LAB — I-STAT CHEM 8, ED
BUN: <3 mg/dL — ABNORMAL LOW (ref 6–20)
CHLORIDE: 103 mmol/L (ref 98–111)
Calcium, Ion: 1.1 mmol/L — ABNORMAL LOW (ref 1.15–1.40)
Creatinine, Ser: 0.9 mg/dL (ref 0.61–1.24)
GLUCOSE: 94 mg/dL (ref 70–99)
HCT: 41 % (ref 39.0–52.0)
Hemoglobin: 13.9 g/dL (ref 13.0–17.0)
Potassium: 3 mmol/L — ABNORMAL LOW (ref 3.5–5.1)
SODIUM: 139 mmol/L (ref 135–145)
TCO2: 21 mmol/L — AB (ref 22–32)

## 2017-12-14 LAB — ETHANOL

## 2017-12-14 LAB — I-STAT CG4 LACTIC ACID, ED: LACTIC ACID, VENOUS: 1.44 mmol/L (ref 0.5–1.9)

## 2017-12-14 MED ORDER — LORAZEPAM 2 MG/ML IJ SOLN
1.0000 mg | Freq: Once | INTRAMUSCULAR | Status: AC
  Administered 2017-12-14: 1 mg via INTRAVENOUS
  Filled 2017-12-14: qty 1

## 2017-12-14 MED ORDER — SODIUM CHLORIDE 0.9 % IV BOLUS
1000.0000 mL | Freq: Once | INTRAVENOUS | Status: AC
  Administered 2017-12-14: 1000 mL via INTRAVENOUS

## 2017-12-14 MED ORDER — FENTANYL CITRATE (PF) 100 MCG/2ML IJ SOLN
50.0000 ug | Freq: Once | INTRAMUSCULAR | Status: AC
  Administered 2017-12-14: 50 ug via INTRAVENOUS
  Filled 2017-12-14: qty 2

## 2017-12-14 MED ORDER — TETANUS-DIPHTH-ACELL PERTUSSIS 5-2.5-18.5 LF-MCG/0.5 IM SUSP
0.5000 mL | Freq: Once | INTRAMUSCULAR | Status: AC
  Administered 2017-12-14: 0.5 mL via INTRAMUSCULAR
  Filled 2017-12-14: qty 0.5

## 2017-12-14 MED ORDER — OXYCODONE-ACETAMINOPHEN 5-325 MG PO TABS
1.0000 | ORAL_TABLET | Freq: Once | ORAL | Status: AC
  Administered 2017-12-14: 1 via ORAL
  Filled 2017-12-14: qty 1

## 2017-12-14 MED ORDER — OXYCODONE-ACETAMINOPHEN 5-325 MG PO TABS: 1.0000 | ORAL_TABLET | Freq: Three times a day (TID) | ORAL | 0 refills | Status: DC | PRN
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
## 2017-12-14 NOTE — ED Notes (Signed)
Pt will not remain alert enough to be discharged by self.
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
## 2017-12-14 NOTE — ED Notes (Signed)
Pt admits to using cocaine and meth, states he used early this morning.

## 2017-12-24 ENCOUNTER — Encounter (HOSPITAL_COMMUNITY): Payer: Self-pay | Admitting: Emergency Medicine

## 2017-12-24 ENCOUNTER — Emergency Department (HOSPITAL_COMMUNITY)
Admission: EM | Admit: 2017-12-24 | Discharge: 2017-12-24 | Disposition: A | Discharge status: Home / Self Care | Payer: Medicaid Other | Attending: Emergency Medicine | Admitting: Emergency Medicine

## 2017-12-24 ENCOUNTER — Other Ambulatory Visit: Payer: Self-pay

## 2017-12-24 DIAGNOSIS — Z046 Encounter for general psychiatric examination, requested by authority: Secondary | ICD-10-CM | POA: Insufficient documentation

## 2017-12-24 DIAGNOSIS — F22 Delusional disorders: Secondary | ICD-10-CM | POA: Diagnosis present

## 2017-12-24 DIAGNOSIS — F1910 Other psychoactive substance abuse, uncomplicated: Secondary | ICD-10-CM | POA: Diagnosis not present

## 2017-12-24 DIAGNOSIS — F1410 Cocaine abuse, uncomplicated: Secondary | ICD-10-CM | POA: Diagnosis not present

## 2017-12-24 DIAGNOSIS — F17210 Nicotine dependence, cigarettes, uncomplicated: Secondary | ICD-10-CM | POA: Diagnosis not present

## 2017-12-24 HISTORY — DX: Other stimulant abuse, uncomplicated: F15.10

## 2017-12-24 HISTORY — DX: Bipolar disorder, unspecified: F31.9

## 2017-12-24 HISTORY — DX: Attention-deficit hyperactivity disorder, unspecified type: F90.9

## 2017-12-24 LAB — CBC WITH DIFFERENTIAL/PLATELET
BASOS PCT: 1 %
Basophils Absolute: 0.1 10*3/uL (ref 0.0–0.1)
Eosinophils Absolute: 0.3 10*3/uL (ref 0.0–0.7)
Eosinophils Relative: 2 %
HEMATOCRIT: 49.2 % (ref 39.0–52.0)
HEMOGLOBIN: 17.8 g/dL — AB (ref 13.0–17.0)
LYMPHS ABS: 2.1 10*3/uL (ref 0.7–4.0)
LYMPHS PCT: 11 %
MCH: 31.9 pg (ref 26.0–34.0)
MCHC: 36.2 g/dL — AB (ref 30.0–36.0)
MCV: 88.2 fL (ref 78.0–100.0)
MONO ABS: 2.8 10*3/uL — AB (ref 0.1–1.0)
MONOS PCT: 15 %
NEUTROS ABS: 13.9 10*3/uL — AB (ref 1.7–7.7)
NEUTROS PCT: 71 %
Platelets: 462 10*3/uL — ABNORMAL HIGH (ref 150–400)
RBC: 5.58 MIL/uL (ref 4.22–5.81)
RDW: 12.6 % (ref 11.5–15.5)
WBC: 19.2 10*3/uL — ABNORMAL HIGH (ref 4.0–10.5)

## 2017-12-24 LAB — COMPREHENSIVE METABOLIC PANEL
ALBUMIN: 5.7 g/dL — AB (ref 3.5–5.0)
ALT: 44 U/L (ref 0–44)
ANION GAP: 15 (ref 5–15)
AST: 34 U/L (ref 15–41)
Alkaline Phosphatase: 71 U/L (ref 38–126)
BUN: 10 mg/dL (ref 6–20)
CHLORIDE: 101 mmol/L (ref 98–111)
CO2: 25 mmol/L (ref 22–32)
Calcium: 10.3 mg/dL (ref 8.9–10.3)
Creatinine, Ser: 1.18 mg/dL (ref 0.61–1.24)
GFR calc Af Amer: >60 mL/min (ref >60)
GFR calc non Af Amer: >60 mL/min (ref >60)
GLUCOSE: 131 mg/dL — AB (ref 70–99)
POTASSIUM: 3.5 mmol/L (ref 3.5–5.1)
SODIUM: 141 mmol/L (ref 135–145)
Total Bilirubin: 1 mg/dL (ref 0.3–1.2)
Total Protein: 9 g/dL — ABNORMAL HIGH (ref 6.5–8.1)

## 2017-12-24 LAB — RAPID URINE DRUG SCREEN, HOSP PERFORMED
AMPHETAMINES: POSITIVE — AB
Barbiturates: NOT DETECTED
Benzodiazepines: POSITIVE — AB
Cocaine: POSITIVE — AB
OPIATES: NOT DETECTED
TETRAHYDROCANNABINOL: POSITIVE — AB

## 2017-12-24 LAB — ETHANOL: Alcohol, Ethyl (B): <10 mg/dL (ref <10)

## 2017-12-24 MED ORDER — LORAZEPAM 1 MG PO TABS
2.0000 mg | ORAL_TABLET | Freq: Once | ORAL | Status: AC
  Administered 2017-12-24: 2 mg via ORAL
  Filled 2017-12-24: qty 2

## 2017-12-24 NOTE — ED Triage Notes (Signed)
Pt brought in by [facility] for paranoid schizophrenia. Pt told EMS he took cocaine PTA. When asked amount, he said "a lot" and this is his normal reaction to cocaine use. Pt very paranoid during triage.

## 2017-12-24 NOTE — ED Notes (Signed)
Went to check on pt and he attempted to barricade this nurse in the room due to paranoia.  Was not threatening or aggressive, but wanted to hold door shut while I was in the room and continued to ramble about people trying to get him and it not being safe.  Told pt I was going to check on something and he allowed me to leave the room, but shut door behind me.  MD made aware and IVC papers being done.

## 2017-12-24 NOTE — ED Notes (Signed)
Pt continues to pace around room rambling and paranoid.  Is not paranoid of staff.

## 2017-12-24 NOTE — ED Provider Notes (Signed)
[facility]ENCY [facility]PART[facility] Provider Note   CSN: [id number] Arrival date & time: 12/24/17  [id number]     History   Chief Complaint Chief Complaint  Patient presents with  . V70.1    HPI Scott Huber is a 27 y.o. male.  The history is provided by the patient and the police. The history is limited by the condition of the patient (paranoid).    Pt was seen at 0735.  Per pt, EMS and Police:  Pt brought to ED for paranoia after using cocaine. Pt states he "used a lot" of cocaine and now feels paranoid and having AVH. Endorses hx of similar reaction after using cocaine. The symptoms have been associated with no other complaints. The patient has a significant history of similar symptoms previously, recently being evaluated for this complaint and multiple prior evals for same.        Past Medical History:  Diagnosis Date  . ADHD   . Anxiety   . Bipolar 1 disorder (HCC)   . Genital herpes   . Panic attack   . Stimulant abuse (HCC)     There are no active problems to display for this patient.   History reviewed. No pertinent surgical history.      Home Medications    Prior to Admission medications   Medication Sig Start Date End Date Taking? Authorizing Provider  oxyCODONE-acetaminophen (PERCOCET) 5-325 MG tablet Take 1-2 tablets by mouth every 8 (eight) hours as needed for severe pain. 12/14/17   Mesner, Barbara CowerJason, MD    Family History History reviewed. No pertinent family history.  Social History Social History   Tobacco Use  . Smoking status: Current Every Day Smoker    Packs/day: 1.00    Types: Cigarettes  . Smokeless tobacco: Never Used  Substance Use Topics  . Alcohol use: Yes    Comment: occ  . Drug use: Yes    Types: Cocaine, Methamphetamines    Comment: early this morning      Allergies   Hydrocodone and Vistaril [hydroxyzine hcl]   Review of Systems Review of Systems  Unable to perform ROS: Psychiatric disorder     Physical Exam Updated  Vital Signs BP (!) 162/100 (BP Location: Right Arm)   Pulse (!) 142   Resp 20   Ht 5\' 9"  (1.753 m)   Wt 93 kg   SpO2 97%   BMI 30.27 kg/m   Physical Exam 0740: Physical examination:  Nursing notes reviewed; Vital signs and O2 SAT reviewed;  Constitutional: Well developed, Well nourished, Well hydrated, In no acute distress; Head:  Normocephalic, atraumatic; Eyes: EOMI, PERRL, No scleral icterus; ENMT: Mouth and pharynx normal, Mucous membranes moist; Neck: Supple, Full range of motion; Cardiovascular: Regular rate and rhythm; Respiratory: Breath sounds clear, No wheezes.  Speaking full sentences with ease, Normal respiratory effort/excursion; Chest: No deformity, Movement normal; Abdomen: Nondistended; Extremities: No deformity.; Neuro: AA&Ox3, Major CN grossly intact.  Speech clear. No gross focal motor deficits in extremities. Climbs on and off stretcher easily by himself. Gait steady.; Skin: Color normal, Warm, Dry.; Psych:  Paranoid, looking around in corners of room, rapid speech, rambling.      ED Treatments / Results  Labs (all labs ordered are listed, but only abnormal results are displayed)   EKG None  Radiology   Procedures Procedures (including critical care time)  Medications Ordered in ED Medications  LORazepam (ATIVAN) tablet 2 mg (2 mg Oral Given 12/24/17 0746)     Initial Impression /  Assessment and Plan / ED Course  I have reviewed the triage vital signs and the nursing notes.  Pertinent labs & imaging results that were available during my care of the patient were reviewed by me and considered in my medical decision making (see chart for details).  MDM Reviewed: previous chart, nursing note and vitals Reviewed previous: labs Interpretation: labs    Results for orders placed or performed during the hospital encounter of 12/24/17  Comprehensive metabolic panel  Result Value Ref Range   Sodium 141 135 - 145 mmol/L   Potassium 3.5 3.5 - 5.1 mmol/L    Chloride 101 98 - 111 mmol/L   CO2 25 22 - 32 mmol/L   Glucose, Bld 131 (H) 70 - 99 mg/dL   BUN 10 6 - 20 mg/dL   Creatinine, Ser 6.961.18 0.61 - 1.24 mg/dL   Calcium 29.510.3 8.9 - 28.410.3 mg/dL   Total Protein 9.0 (H) 6.5 - 8.1 g/dL   Albumin 5.7 (H) 3.5 - 5.0 g/dL   AST 34 15 - 41 U/L   ALT 44 0 - 44 U/L   Alkaline Phosphatase 71 38 - 126 U/L   Total Bilirubin 1.0 0.3 - 1.2 mg/dL   GFR calc non Af Amer >60 >60 mL/min   GFR calc Af Amer >60 >60 mL/min   Anion gap 15 5 - 15  Ethanol  Result Value Ref Range   Alcohol, Ethyl (B) <10 <10 mg/dL  CBC with Differential  Result Value Ref Range   WBC 19.2 (H) 4.0 - 10.5 K/uL   RBC 5.58 4.22 - 5.81 MIL/uL   Hemoglobin 17.8 (H) 13.0 - 17.0 g/dL   HCT 13.249.2 44.039.0 - 10.252.0 %   MCV 88.2 78.0 - 100.0 fL   MCH 31.9 26.0 - 34.0 pg   MCHC 36.2 (H) 30.0 - 36.0 g/dL   RDW 72.512.6 36.611.5 - 44.015.5 %   Platelets 462 (H) 150 - 400 K/uL   Neutrophils Relative % 71 %   Neutro Abs 13.9 (H) 1.7 - 7.7 K/uL   Lymphocytes Relative 11 %   Lymphs Abs 2.1 0.7 - 4.0 K/uL   Monocytes Relative 15 %   Monocytes Absolute 2.8 (H) 0.1 - 1.0 K/uL   Eosinophils Relative 2 %   Eosinophils Absolute 0.3 0.0 - 0.7 K/uL   Basophils Relative 1 %   Basophils Absolute 0.1 0.0 - 0.1 K/uL     0740:  Pt pacing around room, rambling speech, barring the doorway and asking Police officer to stand in front of closed exam room door. Pt starting to escalate further. PO ativan given.   [id number]:  Pt barring himself in exam room, hallucinating. Attempting to keep ED staff in exam room with him while barring their egress from the exam room.  IVC paperwork completed.   1000:  Continues paranoid. Police at bedside trying to get pt to open the door. Will have TTS evaluate.   1200:  TTS has evaluated pt:  States pt will need re-eval when sober to determine disposition.   1500:  Appears calmer, now laying on stretcher, exam room door open, Police at doorway. Holding orders written.     Final Clinical  Impressions(s) / ED Diagnoses   Final diagnoses:  None    ED Discharge Orders    None       Samuel JesterMcManus, Jawad Wiacek, DO 12/24/17 1510

## 2017-12-24 NOTE — ED Provider Notes (Signed)
  Physical Exam  BP (!) 142/106   Pulse (!) 126   Resp 20   Ht 5\' 9"  (1.753 m)   Wt 93 kg   SpO2 98%   BMI 30.27 kg/m   Physical Exam  ED Course/Procedures     Procedures  MDM  Patient much more alert and appropriate although still has some tachycardia.  Seen by psychiatry and cleared for discharge.  No longer risk to himself.       Benjiman CorePickering, Clois Montavon, MD 12/24/17 [phone]

## 2017-12-24 NOTE — ED Notes (Signed)
Pt's family member:  Elenor LegatoKristine Anglada [phone]
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
## 2017-12-24 NOTE — ED Notes (Signed)
Pt extremely paranoid.  Pacing around room sweating profusely.  Rapid, rambling speech.  States "I don't need any help from ya'll.  I'm not retarded, I just need someone to help me help myself."  Took medications willingly, but had to close door behind me due to pt being paranoid of pt across the hallway.  Officer in with pt and pt is cooperative with staff and him.

## 2017-12-24 NOTE — ED Notes (Signed)
Room secured and officer attempting to get pt to change into paper scrubs.
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
## 2017-12-24 NOTE — ED Notes (Signed)
Pt less paranoid at this time.  Speaking on phone with sister.  No longer sweating.  Officer at bedside.

## 2017-12-24 NOTE — ED Notes (Signed)
During triage pt extremely paranoid, continuously looking around with eyes darting around floor and in corner, pt assured by RN and [employer] officer that is safe, pt states he is not seeing things that are not really there, however, he stated "there she is" pointing toward the end of the bed and repeatedly asked [employer] officer to stand in front of his closed door, pt is cooperative but defensive and paranoid

## 2017-12-24 NOTE — ED Notes (Signed)
Hospital officer at bedside to get pt to open door.  IVC papers completed and c com notified PD was needed to stay with pt.

## 2018-02-13 ENCOUNTER — Emergency Department (HOSPITAL_COMMUNITY)
Admission: EM | Admit: 2018-02-13 | Discharge: 2018-02-13 | Disposition: A | Discharge status: Home / Self Care | Payer: Medicaid Other | Attending: Emergency Medicine | Admitting: Emergency Medicine

## 2018-02-13 ENCOUNTER — Encounter (HOSPITAL_COMMUNITY): Payer: Self-pay

## 2018-02-13 ENCOUNTER — Other Ambulatory Visit: Payer: Self-pay

## 2018-02-13 DIAGNOSIS — F1410 Cocaine abuse, uncomplicated: Secondary | ICD-10-CM | POA: Diagnosis not present

## 2018-02-13 DIAGNOSIS — R002 Palpitations: Secondary | ICD-10-CM | POA: Diagnosis present

## 2018-02-13 DIAGNOSIS — F17210 Nicotine dependence, cigarettes, uncomplicated: Secondary | ICD-10-CM | POA: Insufficient documentation

## 2018-02-13 DIAGNOSIS — F419 Anxiety disorder, unspecified: Secondary | ICD-10-CM | POA: Insufficient documentation

## 2018-02-13 DIAGNOSIS — R0602 Shortness of breath: Secondary | ICD-10-CM | POA: Insufficient documentation

## 2018-02-13 DIAGNOSIS — R079 Chest pain, unspecified: Secondary | ICD-10-CM | POA: Insufficient documentation

## 2018-02-13 DIAGNOSIS — R61 Generalized hyperhidrosis: Secondary | ICD-10-CM | POA: Diagnosis not present

## 2018-02-13 LAB — I-STAT CHEM 8, ED
BUN: 11 mg/dL (ref 6–20)
CHLORIDE: 101 mmol/L (ref 98–111)
CREATININE: 1.3 mg/dL — AB (ref 0.61–1.24)
Calcium, Ion: 1.11 mmol/L — ABNORMAL LOW (ref 1.15–1.40)
Glucose, Bld: 102 mg/dL — ABNORMAL HIGH (ref 70–99)
HEMATOCRIT: 50 % (ref 39.0–52.0)
Hemoglobin: 17 g/dL (ref 13.0–17.0)
Potassium: 3.4 mmol/L — ABNORMAL LOW (ref 3.5–5.1)
SODIUM: 139 mmol/L (ref 135–145)
TCO2: 21 mmol/L — AB (ref 22–32)

## 2018-02-13 MED ORDER — AMMONIA AROMATIC IN INHA
RESPIRATORY_TRACT | Status: AC
  Filled 2018-02-13: qty 20

## 2018-02-13 MED ORDER — LORAZEPAM 2 MG/ML IJ SOLN
2.0000 mg | Freq: Once | INTRAMUSCULAR | Status: AC
  Administered 2018-02-13: 2 mg via INTRAVENOUS
  Filled 2018-02-13: qty 1

## 2018-02-13 MED ORDER — LACTATED RINGERS IV BOLUS
2000.0000 mL | Freq: Once | INTRAVENOUS | Status: AC
  Administered 2018-02-13: 2000 mL via INTRAVENOUS

## 2018-02-13 NOTE — ED Notes (Signed)
RN attempting to perform EKG per charge RN due to HR 158bpm on dinamap. Pt pulling EKG monitor leads off, restless, verbally aggressive with staff. Pt diaphoretic and drinking water out of the sink. Pt will not sit still for nursing staff to perform EKG and full set of vitals. Pt states repeatedly, "I've been poisoned. You don't understand. Ya'll think I'm high because of the last few days. But I'm not. I've been poisoned. I need water. I need to get back there". Pt points back to ED rooms while saying this. Procedures explained to pt and how we are trying to help him but pt won't listen nor cooperate with staff. Pt jumps up in triage and pulls BP cuff off and then attempts to pull EKG monitor while attached to him out of the wall and drags it to the sink so he can drink out of the sink.

## 2018-02-13 NOTE — ED Notes (Signed)
Patient family called again for patient pick up.  No answer.

## 2018-02-13 NOTE — ED Notes (Signed)
Patient brother called to pick up patient for discharge, brother stated he would find someone to come get him in " about 10 minutes."

## 2018-02-13 NOTE — ED Notes (Signed)
Report given to [facility]. 

## 2018-02-13 NOTE — ED Triage Notes (Signed)
Pt reports snorted cocaine ago  and feels like someone poisoned him.  Pt says feels like his insides are shaking.    Pt unable to sit still.  Pt repeatedly states, "I've been poisoned."

## 2018-02-13 NOTE — ED Notes (Signed)
Pt refusing to let staff perform ekg or put on the monitor.  EDP aware.  Pt drinking water out of the sink.

## 2018-02-13 NOTE — ED Notes (Signed)
Pt drowsy

## 2018-02-14 NOTE — ED Provider Notes (Signed)
Emergency Department Provider Note   I have reviewed the triage vital signs and the nursing notes.   HISTORY  Chief Complaint Drug Overdose   HPI LARRI Scott Huber is a 27 y.o. male with medical problems as documented below the presents the emergency department today with acute cocaine intoxication and associated anxiety thinking he is going to die.  Has palpitations, chest pain, shortness of breath and states he is sweaty.  Just did cocaine a little while ago.  Has a history of methamphetamine use as well but denies that at this time. No other associated or modifying symptoms.    Past Medical History:  Diagnosis Date  . ADHD   . Anxiety   . Bipolar 1 disorder (HCC)   . Genital herpes   . Panic attack   . Stimulant abuse (HCC)     There are no active problems to display for this patient.   History reviewed. No pertinent surgical history.  Current Outpatient Rx  . Order #: [id number] Class: Print    Allergies Hydrocodone and Vistaril [hydroxyzine hcl]  No family history on file.  Social History Social History   Tobacco Use  . Smoking status: Current Every Day Smoker    Packs/day: 1.00    Types: Cigarettes  . Smokeless tobacco: Never Used  Substance Use Topics  . Alcohol use: Yes    Comment: occ  . Drug use: Yes    Types: Cocaine, Methamphetamines    Comment: early this morning     Review of Systems  All other systems negative except as documented in the HPI. All pertinent positives and negatives as reviewed in the HPI. ____________________________________________   PHYSICAL EXAM:  VITAL SIGNS: ED Triage Vitals  Enc Vitals Group     BP 02/13/18 1723 (!) 140/103     Pulse Rate 02/13/18 1616 (!) 158     Resp 02/13/18 1616 20     Temp 02/13/18 2142 98.6 F (37 C)     Temp Source 02/13/18 2142 Oral     SpO2 02/13/18 1616 100 %     Weight 02/13/18 1608 185 lb (83.9 kg)     Height 02/13/18 1608 5\' 9"  (1.753 m)    Constitutional: Alert and  oriented. Well appearing and in no acute distress. Eyes: Conjunctivae are normal. PERRL. EOMI. Head: Atraumatic. Nose: No congestion/rhinnorhea. Mouth/Throat: Mucous membranes are moist.  Oropharynx non-erythematous. Neck: No stridor.  No meningeal signs.   Cardiovascular: Tachycardic rate, regular rhythm. Good peripheral circulation. Grossly normal heart sounds.   Respiratory: Tachypneic respiratory effort.  No retractions. Lungs CTAB. Gastrointestinal: Soft and nontender. No distention.  Musculoskeletal: No lower extremity tenderness nor edema. No gross deformities of extremities. Neurologic:  Normal speech and language. No gross focal neurologic deficits are appreciated.  Skin:  Skin is warm, diaphoretic and intact. No rash noted.   ____________________________________________   LABS (all labs ordered are listed, but only abnormal results are displayed)  Labs Reviewed  I-STAT CHEM 8, ED - Abnormal; Notable for the following components:      Result Value   Potassium 3.4 (*)    Creatinine, Ser 1.30 (*)    Glucose, Bld 102 (*)    Calcium, Ion 1.11 (*)    TCO2 21 (*)    All other components within normal limits   ____________________________________________  EKG   EKG Interpretation  Date/Time:  Monday February 13 2018 16:22:20 EDT Ventricular Rate:  130 PR Interval:    QRS Duration: 78 QT Interval:  292  QTC Calculation: 430 R Axis:   -83 Text Interpretation:  Sinus tachycardia Left anterior fascicular block ST elev, probable normal early repol pattern No significant change since last tracing Confirmed by Marily Memos [phone]) on 02/14/2018 12:24:44 AM       ____________________________________________  RADIOLOGY  No results found.  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  Given fluids and benzodiazepines until patient was more calm and heart rate improved.  Patient with improved symptoms.  Low suspicion for serotonin syndrome, PE or other pathology. Counseled to quit.      Pertinent labs & imaging results that were available during my care of the patient were reviewed by me and considered in my medical decision making (see chart for details).  ____________________________________________  FINAL CLINICAL IMPRESSION(S) / ED DIAGNOSES  Final diagnoses:  Cocaine abuse (HCC)     MEDICATIONS GIVEN DURING THIS VISIT:  Medications  ammonia inhalant (has no administration in time range)  lactated ringers bolus 2,000 mL (0 mLs Intravenous Stopped 02/13/18 2033)  LORazepam (ATIVAN) injection 2 mg (2 mg Intravenous Given 02/13/18 1632)     NEW OUTPATIENT MEDICATIONS STARTED DURING THIS VISIT:  Discharge Medication List as of 02/13/2018  8:17 PM      Note:  This note was prepared with assistance of Dragon voice recognition software. Occasional wrong-word or sound-a-like substitutions may have occurred due to the inherent limitations of voice recognition software.   Jovanny Stephanie, Barbara Cower, MD 02/14/18 [phone]

## 2018-03-10 ENCOUNTER — Encounter (HOSPITAL_COMMUNITY): Payer: Self-pay | Admitting: Emergency Medicine

## 2018-03-10 ENCOUNTER — Other Ambulatory Visit: Payer: Self-pay

## 2018-03-10 ENCOUNTER — Emergency Department (HOSPITAL_COMMUNITY)
Admission: EM | Admit: 2018-03-10 | Discharge: 2018-03-10 | Disposition: A | Discharge status: Home / Self Care | Payer: Medicaid Other | Attending: Emergency Medicine | Admitting: Emergency Medicine

## 2018-03-10 ENCOUNTER — Emergency Department (HOSPITAL_COMMUNITY): Payer: Medicaid Other

## 2018-03-10 DIAGNOSIS — F1910 Other psychoactive substance abuse, uncomplicated: Secondary | ICD-10-CM | POA: Diagnosis not present

## 2018-03-10 DIAGNOSIS — R451 Restlessness and agitation: Secondary | ICD-10-CM | POA: Diagnosis not present

## 2018-03-10 DIAGNOSIS — T50901A Poisoning by unspecified drugs, medicaments and biological substances, accidental (unintentional), initial encounter: Secondary | ICD-10-CM | POA: Insufficient documentation

## 2018-03-10 DIAGNOSIS — F17210 Nicotine dependence, cigarettes, uncomplicated: Secondary | ICD-10-CM | POA: Insufficient documentation

## 2018-03-10 DIAGNOSIS — F419 Anxiety disorder, unspecified: Secondary | ICD-10-CM | POA: Diagnosis not present

## 2018-03-10 DIAGNOSIS — R4182 Altered mental status, unspecified: Secondary | ICD-10-CM | POA: Diagnosis present

## 2018-03-10 DIAGNOSIS — E876 Hypokalemia: Secondary | ICD-10-CM | POA: Diagnosis not present

## 2018-03-10 LAB — CBC WITH DIFFERENTIAL/PLATELET
Abs Immature Granulocytes: 0.03 10*3/uL (ref 0.00–0.07)
Basophils Absolute: 0 10*3/uL (ref 0.0–0.1)
Basophils Relative: 0 %
EOS ABS: 0.4 10*3/uL (ref 0.0–0.5)
EOS PCT: 3 %
HEMATOCRIT: 48.9 % (ref 39.0–52.0)
HEMOGLOBIN: 17.5 g/dL — AB (ref 13.0–17.0)
Immature Granulocytes: 0 %
LYMPHS ABS: 2.6 10*3/uL (ref 0.7–4.0)
LYMPHS PCT: 22 %
MCH: 31.1 pg (ref 26.0–34.0)
MCHC: 35.8 g/dL (ref 30.0–36.0)
MCV: 87 fL (ref 80.0–100.0)
MONO ABS: 1.7 10*3/uL — AB (ref 0.1–1.0)
MONOS PCT: 14 %
NRBC: 0 % (ref 0.0–0.2)
Neutro Abs: 7.3 10*3/uL (ref 1.7–7.7)
Neutrophils Relative %: 61 %
Platelets: 405 10*3/uL — ABNORMAL HIGH (ref 150–400)
RBC: 5.62 MIL/uL (ref 4.22–5.81)
RDW: 12 % (ref 11.5–15.5)
WBC: 12.1 10*3/uL — ABNORMAL HIGH (ref 4.0–10.5)

## 2018-03-10 LAB — ETHANOL: Alcohol, Ethyl (B): <10 mg/dL (ref <10)

## 2018-03-10 LAB — I-STAT TROPONIN, ED: TROPONIN I, POC: 0 ng/mL (ref 0.00–0.08)

## 2018-03-10 LAB — COMPREHENSIVE METABOLIC PANEL
ALK PHOS: 63 U/L (ref 38–126)
ALT: 19 U/L (ref 0–44)
ANION GAP: 13 (ref 5–15)
AST: 23 U/L (ref 15–41)
Albumin: 5.4 g/dL — ABNORMAL HIGH (ref 3.5–5.0)
BILIRUBIN TOTAL: 1 mg/dL (ref 0.3–1.2)
BUN: 13 mg/dL (ref 6–20)
CALCIUM: 9.8 mg/dL (ref 8.9–10.3)
CO2: 22 mmol/L (ref 22–32)
Chloride: 99 mmol/L (ref 98–111)
Creatinine, Ser: 1.11 mg/dL (ref 0.61–1.24)
GFR calc non Af Amer: >60 mL/min (ref >60)
Glucose, Bld: 137 mg/dL — ABNORMAL HIGH (ref 70–99)
Potassium: 3 mmol/L — ABNORMAL LOW (ref 3.5–5.1)
Sodium: 134 mmol/L — ABNORMAL LOW (ref 135–145)
Total Protein: 8.6 g/dL — ABNORMAL HIGH (ref 6.5–8.1)

## 2018-03-10 MED ORDER — LORAZEPAM 2 MG/ML IJ SOLN
2.0000 mg | Freq: Once | INTRAMUSCULAR | Status: AC
  Administered 2018-03-10: 2 mg via INTRAVENOUS
  Filled 2018-03-10: qty 1

## 2018-03-10 MED ORDER — SODIUM CHLORIDE 0.9 % IV BOLUS
1000.0000 mL | Freq: Once | INTRAVENOUS | Status: AC
  Administered 2018-03-10: 1000 mL via INTRAVENOUS

## 2018-03-10 MED ORDER — POTASSIUM CHLORIDE CRYS ER 20 MEQ PO TBCR: 40.0000 meq | EXTENDED_RELEASE_TABLET | Freq: Once | ORAL | Status: DC

## 2018-03-10 MED ORDER — POTASSIUM CHLORIDE CRYS ER 20 MEQ PO TBCR: 20.0000 meq | EXTENDED_RELEASE_TABLET | Freq: Two times a day (BID) | ORAL | 0 refills | Status: DC

## 2018-03-10 NOTE — ED Notes (Signed)
Pt ambulatory to the restroom. Pt removed his own IV. Pt has blood on him and refuses to let this nurse clean him or clean himself. Pt laying in the bed refusing to move.

## 2018-03-10 NOTE — ED Notes (Signed)
Pts phone found in the bathroom sink and returned to his room on the counter.

## 2018-03-10 NOTE — ED Provider Notes (Signed)
And change of shift, care of the patient was accepted, he was able to wake up, talk to me, ambulate back and forth to the bathroom and drink water without difficulty.  He denies intentionally trying to harm himself.  At this time he will be discharged into the care of law enforcement.   Eber Hong, MD 03/10/18 2329

## 2018-03-10 NOTE — ED Provider Notes (Signed)
[facility] EMERGENCY DEPARTMENT Provider Note   CSN: [id number] Arrival date & time: 03/10/18  1631   LEVEL 5 CAVEAT - ALTERED MENTAL STATUS   History   Chief Complaint Chief Complaint  Patient presents with  . Drug Overdose    HPI Scott Huber is a 27 y.o. male.  HPI  27 year old male presents with a drug overdose.  The patient is an overall poor historian, likely due to the acute overdose.  He states that he has been using cocaine, meth, and heroin intermittently each over the last 3 days.  He states his been out for 3 days straight.  He feels like his chest is being suffocated.  He also notes some abnormality with his arms and keeps looking at his arms but he cannot tell me what is going on.  He is concerned that the cocaine was poisoned with something.  Past Medical History:  Diagnosis Date  . ADHD   . Anxiety   . Bipolar 1 disorder (HCC)   . Genital herpes   . Panic attack   . Stimulant abuse (HCC)     There are no active problems to display for this patient.   History reviewed. No pertinent surgical history.      Home Medications    Prior to Admission medications   Medication Sig Start Date End Date Taking? Authorizing Provider  oxyCODONE-acetaminophen (PERCOCET) 5-325 MG tablet Take 1-2 tablets by mouth every 8 (eight) hours as needed for severe pain. 12/14/17   Mesner, Barbara Cower, MD    Family History History reviewed. No pertinent family history.  Social History Social History   Tobacco Use  . Smoking status: Current Every Day Smoker    Packs/day: 1.00    Types: Cigarettes  . Smokeless tobacco: Never Used  Substance Use Topics  . Alcohol use: Yes    Comment: occ  . Drug use: Yes    Types: Cocaine, Methamphetamines    Comment: heroine      Allergies   Hydrocodone and Vistaril [hydroxyzine hcl]   Review of Systems Review of Systems  Unable to perform ROS: Mental status change     Physical Exam Updated Vital Signs BP 112/83   Pulse 71    Temp 97.8 F (36.6 C) (Oral)   Resp 11   Ht 5\' 9"  (1.753 m)   SpO2 97%   BMI 27.32 kg/m   Physical Exam  Constitutional: He appears well-developed and well-nourished.  HENT:  Head: Normocephalic and atraumatic.  Right Ear: External ear normal.  Left Ear: External ear normal.  Nose: Nose normal.  Eyes: Pupils are equal, round, and reactive to light. Right eye exhibits no discharge. Left eye exhibits no discharge.  Neck: Neck supple.  Cardiovascular: Regular rhythm and normal heart sounds. Tachycardia present.  Pulmonary/Chest: Effort normal and breath sounds normal.  Abdominal: Soft. There is no tenderness.  Musculoskeletal: He exhibits no edema.  Neurological: He is alert.  Patient is awake and alert.  He thinks it Saturday or Sunday but otherwise knows it is November 2019.  No facial droop or slurred speech.  Equal strength in all 4 extremities.  Skin: Skin is warm and dry. He is not diaphoretic.  Psychiatric: His mood appears anxious. He is not actively hallucinating.  Nursing note and vitals reviewed.    ED Treatments / Results  Labs (all labs ordered are listed, but only abnormal results are displayed) Labs Reviewed  CBC WITH DIFFERENTIAL/PLATELET - Abnormal; Notable for the following components:  Result Value   WBC 12.1 (*)    Hemoglobin 17.5 (*)    Platelets 405 (*)    Monocytes Absolute 1.7 (*)    All other components within normal limits  COMPREHENSIVE METABOLIC PANEL - Abnormal; Notable for the following components:   Sodium 134 (*)    Potassium 3.0 (*)    Glucose, Bld 137 (*)    Total Protein 8.6 (*)    Albumin 5.4 (*)    All other components within normal limits  ETHANOL  I-STAT TROPONIN, ED    EKG EKG Interpretation  Date/Time:  Friday March 10 2018 16:32:47 EDT Ventricular Rate:  125 PR Interval:    QRS Duration: 110 QT Interval:  334 QTC Calculation: 482 R Axis:   -31 Text Interpretation:  Sinus or ectopic atrial tachycardia Left  axis deviation Low voltage, extremity and precordial leads Consider anterior infarct Abnormal T, consider ischemia, lateral leads similar to Feb 13 2018 Confirmed by Pricilla Loveless [phone]) on 03/10/2018 5:02:05 PM   Radiology Dg Chest Portable 1 View  Result Date: 03/10/2018 CLINICAL DATA:  Chest pain EXAM: PORTABLE CHEST 1 VIEW COMPARISON:  12/14/2017 FINDINGS: Cardiac shadow is within normal limits. The lungs are well aerated bilaterally. No focal infiltrate or sizable effusion is seen. No bony abnormality is noted. IMPRESSION: No active disease. Electronically Signed   By: Alcide Clever M.D.   On: 03/10/2018 17:32    Procedures Procedures (including critical care time)  Medications Ordered in ED Medications  potassium chloride SA (K-DUR,KLOR-CON) CR tablet 40 mEq (40 mEq Oral Not Given 03/10/18 1810)  sodium chloride 0.9 % bolus 1,000 mL (0 mLs Intravenous Stopped 03/10/18 1934)  sodium chloride 0.9 % bolus 1,000 mL (0 mLs Intravenous Stopped 03/10/18 1814)  LORazepam (ATIVAN) injection 2 mg (2 mg Intravenous Given 03/10/18 1654)     Initial Impression / Assessment and Plan / ED Course  I have reviewed the triage vital signs and the nursing notes.  Pertinent labs & imaging results that were available during my care of the patient were reviewed by me and considered in my medical decision making (see chart for details).     Patient's heart rate and agitation have significantly improved after Ativan and fluids.  He is noted to be mildly hypokalemic but otherwise his labs are reassuring.  The mild WBC elevation is likely from the acute agitation and sympathomimetic drug overdose rather than an acute infection.  Currently he is still sleeping.  When awake, if he is back to baseline, I believe he stable for discharge home with potassium replacement. Care to Dr. Particia Nearing.  Final Clinical Impressions(s) / ED Diagnoses   Final diagnoses:  Polysubstance abuse (HCC)  Hypokalemia    ED  Discharge Orders    None       Pricilla Loveless, MD 03/10/18 2107

## 2018-03-10 NOTE — ED Notes (Signed)
Attempted to get vital signs-patient refused.

## 2018-03-10 NOTE — ED Notes (Signed)
Pt ambulatory to the bathroom without diff.  Pt drank full glass of ice water.  Pt is awake and alert, calm and cooperative, offers no complaints.

## 2018-03-10 NOTE — Discharge Instructions (Signed)
You may return to the ER for severe or worsening symptoms.  Take the potassium as prescribed for 3 days  Substance Abuse Treatment Programs  Intensive Outpatient Programs [facility]     [address]      [place], [place]                   [phone]       [facility] [address] [place] [address] [place], [place] [phone]  [facility]     (Inpatient and outpatient)     [address]           [phone]    [facility] [phone] (Suboxone and Methadone)  [address]      [place], [place] [address]      [phone]       [address] [place], [place] [phone]  [facility] (Outpatient/Inpatient, Chemical)    (insurance only) [phone]             [facility] (Groups & Residential) [place], [place] [phone]     [facility]     [address]     [place], [place]      [phone]       [facility] (for caregivers and family) [phone] [place], [place] [phone]      Residential Treatment Programs [facility]      [address] [place], [place] [address]  [phone]       [facility] [address] [place], [place] [address] [phone]  [facility] [place]        [phone]       [facility] [phone]  [facility] ([facility]             [address]                                         [place], [place]                                                [phone] or [phone]                               [facility] [address] [place], [address] [phone]  [facility]    [address]      [place], [place]     [phone]       [facility] [address] [place], [place] [phone]  [facility]   [address] [place], [place]  [address]     [phone]      Admissions: 8am-3pm M-F  [facility] (RTS) [address] [place], [place] [phone]  [facility]: Residential Program [phone] Days)   [place], [place]      [phone] or [phone]     [facility]: [facility] [place], [place] (Walk in Hours over the weekend or by referral)  [facility] [address] [place], [place], [place] [address] [phone]  Crisis Mobile: [facility]:  [phone] (for crisis response 24  hours a day) [facility] Hotline:      ([phone] Outpatient Psychiatry and Counseling  [facility]: Mobile Crisis Management 24 hours:  [phone]  [facility] of the [employer] sliding scale fee and walk in schedule: M-F 8am-12pm/1pm-3pm [address]  [place], [place] [address] [phone]  [facility] [address] [place], [place] [address] [phone]  [facility] ([facility])- new patient walk-in appointments available Monday - Friday 8am -3pm.          [address] [place], [place] [address] [phone] or crisis line- [phone]  [facility]/ Intensive Outpatient Therapy Program [address] [place], [place] [address] [phone]  [facility]                  Crisis Services      [phone]     [place], [place] [address]                 [facility]   [facility] [phone]. [address] [place], [place] [address]   [facility] of Care          [address] Bea Laura  [place], [place] [address]       [phone]  [facility] [address] [place], [place] [address] [phone]  [facility]    [address]    [place], [place] [address]     [phone]       Andee Poles,  MD     [address]     [place] [address]     [phone]       [facility] [address] [place] [address]  [facility]     [address] [place], [place]      [phone]       [facility] Eulogio Ditch, MD [address] [place], [place] [address] [phone]  [facility]     [address]     [place], [place] [address]     [phone]       [facility] [address] [place], [place] [address] [phone] Resources for Temporary Residential Assistance/Crisis Centers  [facility]ENT[facility]) M-F 8am-3pm   [address] [place], [place] [address]   [phone] Services include: laundry, barbering, support groups, case management, phone  & computer access, showers, AA/NA mtgs, mental health/substance abuse nurse, job skills class, disability information, VA assistance, spiritual classes, etc.   HOMELESS SHELTERS  [facility]     [facility]   [address] [place]     [phone]              [employer] (women and children)       [address] [place], [place] [address] [phone] [email or web address] for application and process Application Required  [facility]   [address]    [place] [address]     [phone]                    [facility] [place] [address] [place], [place] [address] [phone] application appt.) Application Required  [employer] (women only)  [place], [place] [address]     [phone]      Intake starts 6pm daily Need valid ID, SSC, & Police report [facility] [address] [place], [place] [phone] Application Required  [employer] (men only)     [place].      [place],  [place]       [facility] (Pregnant women only) [address] [place], [place]  [facility]      [place] [address]      [place], [place] [address]     [phone]             [facility] [address] [place], [place] 90 day commitment/SA/Application process  [facility](men only)     [address]     [place], [place]       Check-in at [facility] [address] [place],  [address] [phone] Men/Women/Women and Children must be there by 7 pm  [place], [place]

## 2018-03-10 NOTE — ED Triage Notes (Signed)
Pt called EMS prior to snorting cocaine.  Pt states he did heroine, cocaine and meth today.  Pt states " I think ive been poisoned and my lunged are closing"

## 2018-03-10 NOTE — ED Notes (Signed)
ED Provider at bedside. 

## 2018-04-05 ENCOUNTER — Encounter (HOSPITAL_COMMUNITY): Payer: Self-pay | Admitting: Emergency Medicine

## 2018-04-05 ENCOUNTER — Emergency Department (HOSPITAL_COMMUNITY)
Admission: EM | Admit: 2018-04-05 | Discharge: 2018-04-05 | Disposition: A | Discharge status: Home / Self Care | Payer: Medicaid Other | Attending: Emergency Medicine | Admitting: Emergency Medicine

## 2018-04-05 ENCOUNTER — Other Ambulatory Visit: Payer: Self-pay

## 2018-04-05 DIAGNOSIS — E876 Hypokalemia: Secondary | ICD-10-CM | POA: Insufficient documentation

## 2018-04-05 DIAGNOSIS — F17210 Nicotine dependence, cigarettes, uncomplicated: Secondary | ICD-10-CM | POA: Insufficient documentation

## 2018-04-05 DIAGNOSIS — R21 Rash and other nonspecific skin eruption: Secondary | ICD-10-CM | POA: Diagnosis not present

## 2018-04-05 DIAGNOSIS — F3112 Bipolar disorder, current episode manic without psychotic features, moderate: Secondary | ICD-10-CM | POA: Diagnosis not present

## 2018-04-05 DIAGNOSIS — F909 Attention-deficit hyperactivity disorder, unspecified type: Secondary | ICD-10-CM | POA: Insufficient documentation

## 2018-04-05 DIAGNOSIS — F1410 Cocaine abuse, uncomplicated: Secondary | ICD-10-CM | POA: Insufficient documentation

## 2018-04-05 DIAGNOSIS — Z79899 Other long term (current) drug therapy: Secondary | ICD-10-CM | POA: Insufficient documentation

## 2018-04-05 DIAGNOSIS — R4182 Altered mental status, unspecified: Secondary | ICD-10-CM | POA: Diagnosis present

## 2018-04-05 LAB — RAPID URINE DRUG SCREEN, HOSP PERFORMED
AMPHETAMINES: POSITIVE — AB
BARBITURATES: NOT DETECTED
BENZODIAZEPINES: NOT DETECTED
Cocaine: POSITIVE — AB
Opiates: NOT DETECTED
TETRAHYDROCANNABINOL: POSITIVE — AB

## 2018-04-05 LAB — COMPREHENSIVE METABOLIC PANEL
ALBUMIN: 4.8 g/dL (ref 3.5–5.0)
ALT: 22 U/L (ref 0–44)
AST: 16 U/L (ref 15–41)
Alkaline Phosphatase: 51 U/L (ref 38–126)
Anion gap: 13 (ref 5–15)
BILIRUBIN TOTAL: 0.9 mg/dL (ref 0.3–1.2)
BUN: 9 mg/dL (ref 6–20)
CHLORIDE: 103 mmol/L (ref 98–111)
CO2: 21 mmol/L — ABNORMAL LOW (ref 22–32)
CREATININE: 1.1 mg/dL (ref 0.61–1.24)
Calcium: 9.6 mg/dL (ref 8.9–10.3)
GFR calc Af Amer: >60 mL/min (ref >60)
GLUCOSE: 104 mg/dL — AB (ref 70–99)
POTASSIUM: 3.3 mmol/L — AB (ref 3.5–5.1)
Sodium: 137 mmol/L (ref 135–145)
TOTAL PROTEIN: 7.5 g/dL (ref 6.5–8.1)

## 2018-04-05 LAB — CBC WITH DIFFERENTIAL/PLATELET
ABS IMMATURE GRANULOCYTES: 0.04 10*3/uL (ref 0.00–0.07)
Basophils Absolute: 0.1 10*3/uL (ref 0.0–0.1)
Basophils Relative: 1 %
Eosinophils Absolute: 0.5 10*3/uL (ref 0.0–0.5)
Eosinophils Relative: 4 %
HEMATOCRIT: 44.6 % (ref 39.0–52.0)
Hemoglobin: 15 g/dL (ref 13.0–17.0)
IMMATURE GRANULOCYTES: 0 %
LYMPHS ABS: 1.7 10*3/uL (ref 0.7–4.0)
Lymphocytes Relative: 15 %
MCH: 29.8 pg (ref 26.0–34.0)
MCHC: 33.6 g/dL (ref 30.0–36.0)
MCV: 88.7 fL (ref 80.0–100.0)
MONO ABS: 1.2 10*3/uL — AB (ref 0.1–1.0)
Monocytes Relative: 11 %
NEUTROS ABS: 7.4 10*3/uL (ref 1.7–7.7)
NEUTROS PCT: 69 %
Platelets: 349 10*3/uL (ref 150–400)
RBC: 5.03 MIL/uL (ref 4.22–5.81)
RDW: 12.2 % (ref 11.5–15.5)
WBC: 10.8 10*3/uL — ABNORMAL HIGH (ref 4.0–10.5)
nRBC: 0 % (ref 0.0–0.2)

## 2018-04-05 LAB — SEDIMENTATION RATE: Sed Rate: 6 mm/hr (ref 0–16)

## 2018-04-05 LAB — RAPID HIV SCREEN (HIV 1/2 AB+AG)
HIV 1/2 Antibodies: NONREACTIVE
HIV-1 P24 Antigen - HIV24: NONREACTIVE

## 2018-04-05 LAB — RPR: RPR Ser Ql: NONREACTIVE

## 2018-04-05 LAB — ETHANOL: Alcohol, Ethyl (B): <10 mg/dL (ref <10)

## 2018-04-05 MED ORDER — NICOTINE 21 MG/24HR TD PT24: 21.0000 mg | MEDICATED_PATCH | Freq: Every day | TRANSDERMAL | Status: DC

## 2018-04-05 MED ORDER — POTASSIUM CHLORIDE CRYS ER 20 MEQ PO TBCR
40.0000 meq | EXTENDED_RELEASE_TABLET | Freq: Once | ORAL | Status: AC
  Administered 2018-04-05: 40 meq via ORAL
  Filled 2018-04-05: qty 2

## 2018-04-05 MED ORDER — ACETAMINOPHEN 325 MG PO TABS: 650.0000 mg | ORAL_TABLET | ORAL | Status: DC | PRN

## 2018-04-05 MED ORDER — ALUM & MAG HYDROXIDE-SIMETH 200-200-20 MG/5ML PO SUSP: 30.0000 mL | Freq: Four times a day (QID) | ORAL | Status: DC | PRN

## 2018-04-05 MED ORDER — ONDANSETRON HCL 4 MG PO TABS: 4.0000 mg | ORAL_TABLET | Freq: Three times a day (TID) | ORAL | Status: DC | PRN

## 2018-04-05 NOTE — ED Notes (Signed)
Patient verbalizes understanding of medications and discharge instructions. No further questions at this time. VSS and patient ambulatory at discharge.   

## 2018-04-05 NOTE — ED Notes (Signed)
Patient reminded of need for urine specimen. 

## 2018-04-05 NOTE — Discharge Instructions (Addendum)
I am concerned that you have a history of bipolar disorder and are not on any medications for it.  I believe that you do need to be on medication.  Please make an appointment with the mental health resources given to you.  Please do not use cocaine or other illicit drugs.  The blood tests for HIV and syphilis are not back yet.  The should be available in the next 1-2 days.  If you have not done so, sign up for My Chart and you will be able to get those results without having to call for them.

## 2018-04-05 NOTE — ED Notes (Signed)
Patient denies any thoughts of SI/HI. When asked if he would like to speak with a behavorial health counselor patient states "I don't have a mental problem, I've got to go to court today and I need to go". MD made aware.

## 2018-04-05 NOTE — ED Triage Notes (Signed)
Pt to ED with c/o he thinks he has been poisoned.  Pt st's he did cocaine 2 hours ago.  Pt with flight of ideas

## 2018-04-05 NOTE — ED Provider Notes (Signed)
[facility] EMERGENCY DEPARTMENT Provider Note   CSN: [id number] Arrival date & time: 04/05/18  0032     History   Chief Complaint Chief Complaint  Patient presents with  . Altered Mental Status    HPI Scott Huber is a 27 y.o. male.  The history is provided by the patient. The history is limited by the condition of the patient (psychiatric condition).  He has history of bipolar disorder, attention deficit disorder, substance abuse and comes in stating that he has spots on his hands that have been there for several weeks.  They seem to come and go and but he is concerned that he may have been poisoned with something in the cocaine that he has been smoking.  He denies any intravenous use.  Only location it has been a constant problem has been his left third finger which has been swelled to the point where he cannot completely flex his finger.  He denies fever or chills.  Denies arthralgias or myalgias.  He states that he did have one episode of unprotected sex.  He denies any drug use except for cocaine.  He denies homicidal ideation and suicidal ideation.  He denies hallucinations.  Past Medical History:  Diagnosis Date  . ADHD   . Anxiety   . Bipolar 1 disorder (HCC)   . Genital herpes   . Panic attack   . Stimulant abuse (HCC)     There are no active problems to display for this patient.   History reviewed. No pertinent surgical history.      Home Medications    Prior to Admission medications   Medication Sig Start Date End Date Taking? Authorizing Provider  oxyCODONE-acetaminophen (PERCOCET) 5-325 MG tablet Take 1-2 tablets by mouth every 8 (eight) hours as needed for severe pain. 12/14/17   Mesner, Barbara Cower, MD  potassium chloride SA (K-DUR,KLOR-CON) 20 MEQ tablet Take 1 tablet (20 mEq total) by mouth 2 (two) times daily for 3 days. 03/10/18 03/13/18  Pricilla Loveless, MD    Family History No family history on file.  Social History Social History     Tobacco Use  . Smoking status: Current Every Day Smoker    Packs/day: 1.00    Types: Cigarettes  . Smokeless tobacco: Never Used  Substance Use Topics  . Alcohol use: Yes    Comment: occ  . Drug use: Yes    Types: Cocaine, Methamphetamines    Comment: heroine      Allergies   Hydrocodone and Vistaril [hydroxyzine hcl]   Review of Systems Review of Systems  Unable to perform ROS: Psychiatric disorder     Physical Exam Updated Vital Signs BP (!) 156/102 (BP Location: Right Arm)   Pulse (!) 115   Temp 97.8 F (36.6 C) (Oral)   Resp 20   Ht 5\' 9"  (1.753 m)   Wt 83.9 kg   SpO2 100%   BMI 27.31 kg/m   Physical Exam  Nursing note and vitals reviewed.  27 year old male, resting comfortably and in no acute distress. Vital signs are significant for elevated heart rate and elevated blood pressure. Oxygen saturation is 100%, which is normal. Head is normocephalic and atraumatic. PERRLA, EOMI. Oropharynx is clear. Neck is nontender and supple without adenopathy or JVD. Back is nontender and there is no CVA tenderness. Lungs are clear without rales, wheezes, or rhonchi. Chest is nontender. Heart has regular rate and rhythm without murmur. Abdomen is soft, flat, nontender without masses or  hepatosplenomegaly and peristalsis is normoactive. Extremities have no cyanosis or edema, full range of motion is present.  Erythematous nonblanching macules are present on the palms of both hands.  There is slight erythema and swelling of the left third PIP joint.  There is no tenderness to palpation.  There is a healing laceration over the flexor surface of the left third MCP joint. Skin is warm and dry without rash. Neurologic: Very agitated with rambling speech which borders on a flight of ideas, cranial nerves are intact, there are no motor or sensory deficits.  ED Treatments / Results  Labs (all labs ordered are listed, but only abnormal results are displayed) Labs Reviewed   COMPREHENSIVE METABOLIC PANEL - Abnormal; Notable for the following components:      Result Value   Potassium 3.3 (*)    CO2 21 (*)    Glucose, Bld 104 (*)    All other components within normal limits  CBC WITH DIFFERENTIAL/PLATELET - Abnormal; Notable for the following components:   WBC 10.8 (*)    Monocytes Absolute 1.2 (*)    All other components within normal limits  RAPID URINE DRUG SCREEN, HOSP PERFORMED - Abnormal; Notable for the following components:   Cocaine POSITIVE (*)    Amphetamines POSITIVE (*)    Tetrahydrocannabinol POSITIVE (*)    All other components within normal limits  ETHANOL  SEDIMENTATION RATE  RPR  RAPID HIV SCREEN (HIV 1/2 AB+AG)    Procedures Procedures   Medications Ordered in ED Medications  nicotine (NICODERM CQ - dosed in mg/24 hours) patch 21 mg (has no administration in time range)  alum & mag hydroxide-simeth (MAALOX/MYLANTA) 200-200-20 MG/5ML suspension 30 mL (has no administration in time range)  ondansetron (ZOFRAN) tablet 4 mg (has no administration in time range)  acetaminophen (TYLENOL) tablet 650 mg (has no administration in time range)  potassium chloride SA (K-DUR,KLOR-CON) CR tablet 40 mEq (40 mEq Oral Given 04/05/18 0352)     Initial Impression / Assessment and Plan / ED Course  I have reviewed the triage vital signs and the nursing notes.  Pertinent labs & imaging results that were available during my care of the patient were reviewed by me and considered in my medical decision making (see chart for details).  Lesions on the hands which, with history of unprotected sex, are worrisome for possible secondary syphilis.  Will check RPR.  Agitated and rambling speech suspicious for manic phase of bipolar disorder.  Old records are reviewed, and he has several recent ED visits for cocaine abuse, also prior ED visits for genital herpes  Screening labs showed mild hypokalemia and is given a dose of oral potassium.  He is refusing TTS  consultation stating he has to be back and [place] to appear in court.  Since he is not homicidal or suicidal, I am not able to keep him under involuntary commitment.  He is given outpatient resources for mental health services.  I have stressed to him the importance of going on medication for his bipolar disorder.  Final Clinical Impressions(s) / ED Diagnoses   Final diagnoses:  Bipolar 1 disorder, manic, moderate (HCC)  Hypokalemia  Cocaine abuse (HCC)  Rash of hands    ED Discharge Orders    None       Dione BoozeGlick, Sage Hammill, MD 04/05/18 [phone]

## 2018-04-06 ENCOUNTER — Encounter (HOSPITAL_COMMUNITY): Payer: Self-pay | Admitting: *Deleted

## 2018-04-06 ENCOUNTER — Other Ambulatory Visit: Payer: Self-pay

## 2018-04-06 ENCOUNTER — Emergency Department (HOSPITAL_COMMUNITY)
Admission: EM | Admit: 2018-04-06 | Discharge: 2018-04-06 | Disposition: A | Discharge status: Home / Self Care | Payer: Medicaid Other | Attending: Emergency Medicine | Admitting: Emergency Medicine

## 2018-04-06 DIAGNOSIS — F1910 Other psychoactive substance abuse, uncomplicated: Secondary | ICD-10-CM | POA: Insufficient documentation

## 2018-04-06 DIAGNOSIS — F17210 Nicotine dependence, cigarettes, uncomplicated: Secondary | ICD-10-CM | POA: Diagnosis not present

## 2018-04-06 DIAGNOSIS — R4182 Altered mental status, unspecified: Secondary | ICD-10-CM | POA: Diagnosis present
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
## 2018-04-06 NOTE — ED Triage Notes (Signed)
EMS dispatched by officers after finding patient in the middle of the road.  Patient admits to "meth and cocaine" in the last 24 hours.  Patient stated to EMS to wanting to be taken to the hospital.

## 2018-04-06 NOTE — Discharge Instructions (Signed)
Substance Abuse Treatment Programs ° °Intensive Outpatient Programs °[facility]     °[address]      °[place], [place]                   °[phone]      ° °[facility] °[address] °[place], [place] °[phone] ° °[facility]     °(Inpatient and outpatient)     °[address]           °[phone]   ° °[facility] °336-288-1484 (Suboxone and Methadone) ° °[address]      °[place], [place] [address]      °336-882-2125      ° °[address] °[place], [place] °852-3033 ° °[facility] (Outpatient/Inpatient, Chemical)    °(insurance only) [phone]      °       °[facility] (Groups & Residential) °[place], [place] °336-389-1413 ° °   °[facility]     °[address]     °[place], [place]      °[phone]      ° °[facility] (for caregivers and family) °[address] °[place], [place] °336-299-4655 ° ° ° ° ° °Residential Treatment Programs °[facility]      °[address], [place], [place] [address]  °([phone]      ° °[facility] °[address] [place], [place] [address] °[phone] ° °[facility]        °336-248-8914      ° °[facility] °1-800-659-3381 ° °[facility]             °[address]                                         °[place], [place]                                                °877-615-2722 or 336-784-9470                              ° °[facility] [place] °[address] °[place], [address] °1.877.941.8954 ° °[facility]    °[address]      °[place], [place]     °[phone]      ° °[facility] °[address] °, [place] °336-285-9073 ° °[facility]   °[address]     °[place], [place] [address]     °[phone]      °Admissions: 8am-3pm M-F ° °[facility] °[address] °[place],  [place] °336-227-7417 ° °[facility]: Residential Program (90 Days)   °[place], [place]      °336-725-8389 or 800-758-6077    ° °[facility] °[place], [place] °(Walk in Hours over the weekend or by referral) ° °[facility] °[address], [place],  [address] °(336) [phone] ° °Crisis Mobile: [facility]:  1-877-626-1772 (for crisis response 24 hours a day) °[facility] Hotline:      1-800-256-2452 °Outpatient Psychiatry and Counseling ° °[facility]: Mobile Crisis   Management 24 hours:  1-877-626-1772 ° °[facility] sliding scale fee and walk in schedule: M-F 8am-12pm/1pm-3pm °[address]  °[place], [place] [address] °[phone] ° °[facility] °[address] °[place], [place] [address] °336-703-9650 ° °[facility] ([facility] known [facility])- new patient walk-in appointments available Monday - Friday 8am -3pm.          °[address] °[place], [place] [address] °336-676-6840 or crisis line- 336-676-6905 ° °[facility] Intensive Outpatient Therapy Program °[address] °[place], [place] [address] °336-832-9804 ° °[facility]                  °Crisis Services      °[phone]      °[address]     °[place], [place] [address]                ° °[facility]   °[facility] °[phone] °[address] °[place], [place] [address] ° ° °[facility]          °[address]  °[place], [place] [address]       °([phone] ° °[facility] °[address] °[place], [place] [address] °336-292-1510 ° °[facility]    °[address]    °[place], [place] [address]     °[phone]      ° °Parish McKinney, MD     °[address]     °[place] [address]     °336-282-1251     °  °[facility] °[address] °[place] [address] ° °[facility]     °[address]     °[place], [place]      °336-542-2076      ° °[facility] °Shamsher Ahluwalia, MD °[address] °[place], [place] [address] °[phone] ° °[facility]     °[address]     °[place], [place] [address]     °336-274-1237      ° °[facility] °[address] °[place], [place] [address] °336-854-4450 °Resources for Temporary Residential Assistance/Crisis Centers ° °DAY CENTERS °[facility] °M-F 8am-3pm   °[address] [place], [place] [address]   336-332-0824 °Services include: laundry, barbering, support groups, case management, phone  & computer access, showers, AA/NA mtgs, mental health/substance abuse nurse, job skills class, disability information, VA assistance, spiritual classes, etc.  ° °HOMELESS SHELTERS ° °[facility]     °[facility]   °[address], [place]     °336.271.5959       °       °[facility] (women and children)       °[address] °[place], [place] [address] °336-275-0820 °[email or web address] for application and process °Application Required ° °[facility]   °[address]    °[place] [address]     °[phone]       °             °[facility] °[address] °[place],  [address] °336.273.5572 °336-235-0363(schedule application appt.) °Application Required ° °[facility] (women only)    °[address]     °[place],  [address]     °336-884-1039      °  Intake starts 6pm daily °Need valid ID, SSC, & Police report °[facility] [place] °[address] °[place], [place] °336-881-5420 °Application Required ° °[facility] (men only)     °[address]      °[place], [place]     °[phone]      ° °[facility] °(Pregnant women only) °[address] °[place], [place] °336-275-0206 ° °[facility]      °[address]      °[place], [place] [address]     °336-722-9951      °       °[facility] °[address] °[place], [place] °336-723-1848 °90 day commitment/SA/Application process ° °[facility](men only)     °[address]     °[place], [place]     °[phone]       °Check-in at 7pm     °       °[facility] °[address] °[place], [place] [address] °336-248-6684 °Men/Women/Women and Children must be there by 7 pm ° °[facility] °[place], [place] °336-722-8721                ° °

## 2018-04-06 NOTE — ED Notes (Signed)
Pt did not want to leave facility. Called Security and [employer] as well as Security escorted pt out.

## 2018-04-20 ENCOUNTER — Other Ambulatory Visit: Payer: Self-pay

## 2018-04-20 ENCOUNTER — Encounter (HOSPITAL_COMMUNITY): Payer: Self-pay | Admitting: Emergency Medicine

## 2018-04-20 ENCOUNTER — Emergency Department (HOSPITAL_COMMUNITY)
Admission: EM | Admit: 2018-04-20 | Discharge: 2018-04-20 | Disposition: A | Discharge status: Home / Self Care | Payer: Medicaid Other | Attending: Emergency Medicine | Admitting: Emergency Medicine

## 2018-04-20 DIAGNOSIS — F17210 Nicotine dependence, cigarettes, uncomplicated: Secondary | ICD-10-CM | POA: Diagnosis not present

## 2018-04-20 DIAGNOSIS — T6591XA Toxic effect of unspecified substance, accidental (unintentional), initial encounter: Secondary | ICD-10-CM

## 2018-04-20 DIAGNOSIS — T65891A Toxic effect of other specified substances, accidental (unintentional), initial encounter: Secondary | ICD-10-CM | POA: Insufficient documentation

## 2018-04-20 LAB — COMPREHENSIVE METABOLIC PANEL
ALT: 27 U/L (ref 0–44)
ANION GAP: 11 (ref 5–15)
AST: 20 U/L (ref 15–41)
Albumin: 5.4 g/dL — ABNORMAL HIGH (ref 3.5–5.0)
Alkaline Phosphatase: 55 U/L (ref 38–126)
BILIRUBIN TOTAL: 0.9 mg/dL (ref 0.3–1.2)
BUN: 8 mg/dL (ref 6–20)
CHLORIDE: 104 mmol/L (ref 98–111)
CO2: 23 mmol/L (ref 22–32)
Calcium: 9.7 mg/dL (ref 8.9–10.3)
Creatinine, Ser: 0.87 mg/dL (ref 0.61–1.24)
GFR calc Af Amer: >60 mL/min (ref >60)
GFR calc non Af Amer: >60 mL/min (ref >60)
Glucose, Bld: 111 mg/dL — ABNORMAL HIGH (ref 70–99)
Potassium: 3.8 mmol/L (ref 3.5–5.1)
SODIUM: 138 mmol/L (ref 135–145)
Total Protein: 8.7 g/dL — ABNORMAL HIGH (ref 6.5–8.1)

## 2018-04-20 LAB — CBC WITH DIFFERENTIAL/PLATELET
Abs Immature Granulocytes: 0.03 10*3/uL (ref 0.00–0.07)
BASOS PCT: 1 %
Basophils Absolute: 0.1 10*3/uL (ref 0.0–0.1)
EOS PCT: 2 %
Eosinophils Absolute: 0.2 10*3/uL (ref 0.0–0.5)
HCT: 48.1 % (ref 39.0–52.0)
HEMOGLOBIN: 16.6 g/dL (ref 13.0–17.0)
IMMATURE GRANULOCYTES: 0 %
Lymphocytes Relative: 20 %
Lymphs Abs: 1.9 10*3/uL (ref 0.7–4.0)
MCH: 30.3 pg (ref 26.0–34.0)
MCHC: 34.5 g/dL (ref 30.0–36.0)
MCV: 87.9 fL (ref 80.0–100.0)
MONO ABS: 1.1 10*3/uL — AB (ref 0.1–1.0)
MONOS PCT: 11 %
NEUTROS PCT: 66 %
Neutro Abs: 6.5 10*3/uL (ref 1.7–7.7)
Platelets: 369 10*3/uL (ref 150–400)
RBC: 5.47 MIL/uL (ref 4.22–5.81)
RDW: 12.1 % (ref 11.5–15.5)
WBC: 9.8 10*3/uL (ref 4.0–10.5)
nRBC: 0 % (ref 0.0–0.2)

## 2018-04-20 LAB — OSMOLALITY: Osmolality: 292 mOsm/kg (ref 275–295)

## 2018-04-20 MED ORDER — LORAZEPAM 2 MG/ML IJ SOLN
1.0000 mg | Freq: Once | INTRAMUSCULAR | Status: DC
  Filled 2018-04-20: qty 1

## 2018-04-20 MED ORDER — LACTATED RINGERS IV BOLUS: 1000.0000 mL | Freq: Once | INTRAVENOUS | Status: DC

## 2018-04-20 NOTE — ED Notes (Signed)
Patient on 12 lead at this time.  

## 2018-04-20 NOTE — ED Triage Notes (Addendum)
Pt states he chugged some cleaning supplies on accident. Pt states he has been drinking etoh tonight. Pt denies any si/hi. Pt states he drank it around 2130.

## 2018-04-20 NOTE — ED Notes (Signed)
This nurse went into room to check on pt and pt left dept. Did not advise staff of leaving.

## 2018-04-20 NOTE — ED Provider Notes (Signed)
Emergency Department Provider Note   I have reviewed the triage vital signs and the nursing notes.   HISTORY  Chief Complaint Ingestion   HPI Scott Huber is a 27 y.o. male who drinks alcohol and states he does not use any other drugs who presents the emergency department today secondary to ingestion of fabulouso.  He says he drank a few ounces of a as he was drinking alcohol and was in a similar bottle.  Was not trying to hurt himself.  Patient states that he thought that he had some type drainage from his nose but that has since improved.  He has no abdominal pain.  No back pain.  No altered mental status.  No other associated symptoms.  This happened around 930. No other associated or modifying symptoms.    Past Medical History:  Diagnosis Date  . ADHD   . Anxiety   . Bipolar 1 disorder (HCC)   . Genital herpes   . Panic attack   . Stimulant abuse (HCC)     There are no active problems to display for this patient.   History reviewed. No pertinent surgical history.  Current Outpatient Rx  . Order #: [id number] Class: Print  . Order #: [id number] Class: Print    Allergies Hydrocodone and Vistaril [hydroxyzine hcl]  No family history on file.  Social History Social History   Tobacco Use  . Smoking status: Current Every Day Smoker    Packs/day: 1.00    Types: Cigarettes  . Smokeless tobacco: Never Used  Substance Use Topics  . Alcohol use: Yes    Comment: occ  . Drug use: Yes    Types: Cocaine, Methamphetamines    Comment: heroine     Review of Systems  All other systems negative except as documented in the HPI. All pertinent positives and negatives as reviewed in the HPI. ____________________________________________   PHYSICAL EXAM:  VITAL SIGNS: ED Triage Vitals  Enc Vitals Group     BP 04/20/18 0017 (!) 144/104     Pulse Rate 04/20/18 0017 96     Resp 04/20/18 0017 18     Temp 04/20/18 0017 98.6 F (37 C)     Temp src --      SpO2  04/20/18 0017 100 %    Constitutional: Alert and oriented but anxious. Well appearing and in no acute distress. Eyes: Conjunctivae are normal. PERRL. EOMI. Head: Atraumatic. Nose: No congestion/rhinnorhea. Mouth/Throat: Mucous membranes are moist.  Oropharynx non-erythematous. Neck: No stridor.  No meningeal signs.   Cardiovascular: Tachycardic rate, regular rhythm. Good peripheral circulation. Grossly normal heart sounds.   Respiratory: Normal respiratory effort.  No retractions. Lungs CTAB. Gastrointestinal: Soft and nontender. No distention.  Musculoskeletal: No lower extremity tenderness nor edema. No gross deformities of extremities. Neurologic:  Normal speech and language. No gross focal neurologic deficits are appreciated.  Skin:  Skin is warm, dry and intact. No rash noted.   ____________________________________________   LABS (all labs ordered are listed, but only abnormal results are displayed)  Labs Reviewed  CBC WITH DIFFERENTIAL/PLATELET - Abnormal; Notable for the following components:      Result Value   Monocytes Absolute 1.1 (*)    All other components within normal limits  COMPREHENSIVE METABOLIC PANEL - Abnormal; Notable for the following components:   Glucose, Bld 111 (*)    Total Protein 8.7 (*)    Albumin 5.4 (*)    All other components within normal limits  OSMOLALITY   ____________________________________________  EKG   EKG Interpretation  Date/Time:  Thursday April 20 2018 00:18:16 EST Ventricular Rate:  119 PR Interval:    QRS Duration: 94 QT Interval:  320 QTC Calculation: 451 R Axis:   -86 Text Interpretation:  Sinus tachycardia Left anterior fascicular block ST elev, probable normal early repol pattern No significant change since last tracing Confirmed by Marily Memos [id number]) on 04/20/2018 12:53:36 AM       ____________________________________________  RADIOLOGY  No results  found.  ____________________________________________   PROCEDURES  Procedure(s) performed:   Procedures   ____________________________________________   INITIAL IMPRESSION / ASSESSMENT AND PLAN / ED COURSE  Slightly tachycardic may be related to anxiety.  However will check a serum awesome, ethanol, CMP and given some fluids and Ativan.  If this stuff is normal I do not believe fabulous so is a significant acid or base to require further observation, scope or other further interventions he can likely be discharged.  Left prior to results but seemed stable on initial evaluation.      Pertinent labs & imaging results that were available during my care of the patient were reviewed by me and considered in my medical decision making (see chart for details).  ____________________________________________  FINAL CLINICAL IMPRESSION(S) / ED DIAGNOSES  Final diagnoses:  Accidental ingestion of substance, initial encounter     MEDICATIONS GIVEN DURING THIS VISIT:  Medications  lactated ringers bolus 1,000 mL (has no administration in time range)  LORazepam (ATIVAN) injection 1 mg (has no administration in time range)     NEW OUTPATIENT MEDICATIONS STARTED DURING THIS VISIT:  New Prescriptions   No medications on file    Note:  This note was prepared with assistance of Dragon voice recognition software. Occasional wrong-word or sound-a-like substitutions may have occurred due to the inherent limitations of voice recognition software.   Tremar Wickens, Barbara Cower, MD 04/20/18 [phone]

## 2018-04-25 ENCOUNTER — Emergency Department (HOSPITAL_COMMUNITY)
Admission: EM | Admit: 2018-04-25 | Discharge: 2018-04-25 | Discharge status: Against Medical Advice | Payer: Medicaid Other | Attending: Emergency Medicine | Admitting: Emergency Medicine

## 2018-04-25 ENCOUNTER — Other Ambulatory Visit: Payer: Self-pay

## 2018-04-25 ENCOUNTER — Encounter (HOSPITAL_COMMUNITY): Payer: Self-pay | Admitting: Emergency Medicine

## 2018-04-25 DIAGNOSIS — F319 Bipolar disorder, unspecified: Secondary | ICD-10-CM | POA: Diagnosis not present

## 2018-04-25 DIAGNOSIS — F17210 Nicotine dependence, cigarettes, uncomplicated: Secondary | ICD-10-CM | POA: Insufficient documentation

## 2018-04-25 DIAGNOSIS — F22 Delusional disorders: Secondary | ICD-10-CM | POA: Diagnosis not present

## 2018-04-25 DIAGNOSIS — F1590 Other stimulant use, unspecified, uncomplicated: Secondary | ICD-10-CM | POA: Diagnosis not present

## 2018-04-25 DIAGNOSIS — R4182 Altered mental status, unspecified: Secondary | ICD-10-CM | POA: Diagnosis not present

## 2018-04-25 DIAGNOSIS — R208 Other disturbances of skin sensation: Secondary | ICD-10-CM | POA: Diagnosis present

## 2018-04-25 LAB — CBC WITH DIFFERENTIAL/PLATELET
Abs Immature Granulocytes: 0.03 10*3/uL (ref 0.00–0.07)
BASOS ABS: 0 10*3/uL (ref 0.0–0.1)
Basophils Relative: 0 %
EOS ABS: 0.2 10*3/uL (ref 0.0–0.5)
EOS PCT: 2 %
HEMATOCRIT: 43.3 % (ref 39.0–52.0)
Hemoglobin: 15 g/dL (ref 13.0–17.0)
Immature Granulocytes: 0 %
LYMPHS ABS: 1.6 10*3/uL (ref 0.7–4.0)
Lymphocytes Relative: 16 %
MCH: 30.5 pg (ref 26.0–34.0)
MCHC: 34.6 g/dL (ref 30.0–36.0)
MCV: 88 fL (ref 80.0–100.0)
MONOS PCT: 8 %
Monocytes Absolute: 0.8 10*3/uL (ref 0.1–1.0)
NRBC: 0 % (ref 0.0–0.2)
Neutro Abs: 7.3 10*3/uL (ref 1.7–7.7)
Neutrophils Relative %: 74 %
Platelets: 302 10*3/uL (ref 150–400)
RBC: 4.92 MIL/uL (ref 4.22–5.81)
RDW: 11.8 % (ref 11.5–15.5)
WBC: 9.9 10*3/uL (ref 4.0–10.5)

## 2018-04-25 LAB — COMPREHENSIVE METABOLIC PANEL
ALT: 19 U/L (ref 0–44)
AST: 19 U/L (ref 15–41)
Albumin: 4.8 g/dL (ref 3.5–5.0)
Alkaline Phosphatase: 53 U/L (ref 38–126)
Anion gap: 7 (ref 5–15)
BUN: 8 mg/dL (ref 6–20)
CO2: 24 mmol/L (ref 22–32)
Calcium: 9 mg/dL (ref 8.9–10.3)
Chloride: 105 mmol/L (ref 98–111)
Creatinine, Ser: 0.94 mg/dL (ref 0.61–1.24)
GFR calc Af Amer: >60 mL/min (ref >60)
GFR calc non Af Amer: >60 mL/min (ref >60)
Glucose, Bld: 104 mg/dL — ABNORMAL HIGH (ref 70–99)
Potassium: 3.7 mmol/L (ref 3.5–5.1)
SODIUM: 136 mmol/L (ref 135–145)
Total Bilirubin: 0.7 mg/dL (ref 0.3–1.2)
Total Protein: 7.4 g/dL (ref 6.5–8.1)

## 2018-04-25 LAB — RAPID URINE DRUG SCREEN, HOSP PERFORMED
Amphetamines: POSITIVE — AB
Barbiturates: NOT DETECTED
Benzodiazepines: NOT DETECTED
COCAINE: NOT DETECTED
Opiates: NOT DETECTED
Tetrahydrocannabinol: NOT DETECTED

## 2018-04-25 LAB — ETHANOL: Alcohol, Ethyl (B): <10 mg/dL (ref <10)

## 2018-04-25 MED ORDER — OLANZAPINE 5 MG PO TBDP
10.0000 mg | ORAL_TABLET | ORAL | Status: AC
  Administered 2018-04-25: 10 mg via ORAL
  Filled 2018-04-25: qty 2

## 2018-04-25 NOTE — ED Triage Notes (Signed)
Patient complaining of anxiety, tingling and trembling to bilateral hands, and rash to bilateral hands starting today.

## 2018-04-25 NOTE — ED Provider Notes (Signed)
[facility] EMERGENCY DEPARTMENT Provider Note   CSN: [id number] Arrival date & time: 04/25/18  2130     History   Chief Complaint Chief Complaint  Patient presents with  . Anxiety    HPI Scott Huber is a 27 y.o. male.  Patient with history of anxiety disorder, bipolar disorder, polysubstance abuse presents to the emergency department with strange behavior.  Patient reports that his skin is changing colors and is peeling off and it is very concerning to him.  He has also identified a clear liquid draining from his nose that has caused him significant concern and anxiety.  He cannot tell me how long the symptoms have been ongoing.  Patient having difficulty focusing on questions to answer them appropriately, keeps showing me his hands in various areas of his skin. Level V Caveat due to psychiatric condition.     Past Medical History:  Diagnosis Date  . ADHD   . Anxiety   . Bipolar 1 disorder (HCC)   . Genital herpes   . Panic attack   . Stimulant abuse (HCC)     There are no active problems to display for this patient.   History reviewed. No pertinent surgical history.      Home Medications    Prior to Admission medications   Medication Sig Start Date End Date Taking? Authorizing Provider  oxyCODONE-acetaminophen (PERCOCET) 5-325 MG tablet Take 1-2 tablets by mouth every 8 (eight) hours as needed for severe pain. 12/14/17   Mesner, Barbara Cower, MD  potassium chloride SA (K-DUR,KLOR-CON) 20 MEQ tablet Take 1 tablet (20 mEq total) by mouth 2 (two) times daily for 3 days. 03/10/18 03/13/18  Pricilla Loveless, MD    Family History History reviewed. No pertinent family history.  Social History Social History   Tobacco Use  . Smoking status: Current Every Day Smoker    Packs/day: 1.00    Types: Cigarettes  . Smokeless tobacco: Never Used  Substance Use Topics  . Alcohol use: Yes    Comment: occ  . Drug use: Yes    Types: Cocaine, Methamphetamines    Comment: heroine       Allergies   Hydrocodone and Vistaril [hydroxyzine hcl]   Review of Systems Review of Systems  Unable to perform ROS: Psychiatric disorder     Physical Exam Updated Vital Signs BP (!) 149/99 (BP Location: Right Arm)   Pulse (!) 133   Temp 98.1 F (36.7 C) (Oral)   Resp (!) 22   Ht 5\' 9"  (1.753 m)   Wt 83 kg   SpO2 100%   BMI 27.02 kg/m   Physical Exam Vitals signs and nursing note reviewed.  Constitutional:      General: He is not in acute distress.    Appearance: Normal appearance. He is well-developed.  HENT:     Head: Normocephalic and atraumatic.     Right Ear: Hearing normal.     Left Ear: Hearing normal.     Nose: Nose normal.  Eyes:     Conjunctiva/sclera: Conjunctivae normal.     Pupils: Pupils are equal, round, and reactive to light.  Neck:     Musculoskeletal: Normal range of motion and neck supple.  Cardiovascular:     Rate and Rhythm: Regular rhythm.     Heart sounds: S1 normal and S2 normal. No murmur. No friction rub. No gallop.   Pulmonary:     Effort: Pulmonary effort is normal. No respiratory distress.     Breath sounds: Normal  breath sounds.  Chest:     Chest wall: No tenderness.  Abdominal:     General: Bowel sounds are normal.     Palpations: Abdomen is soft.     Tenderness: There is no abdominal tenderness. There is no guarding or rebound. Negative signs include Murphy's sign and McBurney's sign.     Hernia: No hernia is present.  Musculoskeletal: Normal range of motion.  Skin:    General: Skin is warm and dry.     Findings: No rash.     Comments: Tips of fingers and several toes have very slight flaking and peeling of skin, no redness, drainage  Neurological:     Mental Status: He is alert and oriented to person, place, and time.     GCS: GCS eye subscore is 4. GCS verbal subscore is 5. GCS motor subscore is 6.     Cranial Nerves: No cranial nerve deficit.     Sensory: No sensory deficit.     Coordination: Coordination normal.   Psychiatric:        Speech: Speech normal.        Behavior: Behavior normal.        Thought Content: Thought content normal.      ED Treatments / Results  Labs (all labs ordered are listed, but only abnormal results are displayed) Labs Reviewed  COMPREHENSIVE METABOLIC PANEL - Abnormal; Notable for the following components:      Result Value   Glucose, Bld 104 (*)    All other components within normal limits  RAPID URINE DRUG SCREEN, HOSP PERFORMED - Abnormal; Notable for the following components:   Amphetamines POSITIVE (*)    All other components within normal limits  CBC WITH DIFFERENTIAL/PLATELET  ETHANOL    EKG None  Radiology No results found.  Procedures Procedures (including critical care time)  Medications Ordered in ED Medications  OLANZapine zydis (ZYPREXA) disintegrating tablet 10 mg (10 mg Oral Given 04/25/18 0206)     Initial Impression / Assessment and Plan / ED Course  I have reviewed the triage vital signs and the nursing notes.  Pertinent labs & imaging results that were available during my care of the patient were reviewed by me and considered in my medical decision making (see chart for details).     Patient presents with very bizarre behavior.  He has a history of bipolar disorder, anxiety disorder and polysubstance drug abuse.  He has identified some areas of peeling skin on his fingers and toes and has become very fixed on this.  He cannot focus on anything else or answer other questions appropriately, feels that there is something very wrong with him.  He does have some scaling of the skin on the palmar aspect of some fingers, appears eczematous.  All of this can simply be dry skin.  This does not appear infectious or in any way significant from a medical standpoint, patient is absolutely convinced that it is life-threatening.  His work-up has been unremarkable.  Patient is not homicidal or suicidal.  He does not appear to be a threat to  himself.  He was offered medication to calm him but during the work-up he eloped from the department.  Final Clinical Impressions(s) / ED Diagnoses   Final diagnoses:  Delusional disorder, somatic type [facility](HCC)    ED Discharge Orders    None       Malayasia Mirkin, Canary Brimhristopher J, MD 04/25/18 [phone]

## 2018-04-25 NOTE — ED Notes (Signed)
Pt  left prior to being discharged.

## 2018-04-29 ENCOUNTER — Emergency Department (HOSPITAL_COMMUNITY)
Admission: EM | Admit: 2018-04-29 | Discharge: 2018-04-30 | Disposition: A | Discharge status: Home / Self Care | Payer: Medicaid Other | Attending: Emergency Medicine | Admitting: Emergency Medicine

## 2018-04-29 DIAGNOSIS — F1910 Other psychoactive substance abuse, uncomplicated: Secondary | ICD-10-CM | POA: Diagnosis not present

## 2018-04-29 DIAGNOSIS — R443 Hallucinations, unspecified: Secondary | ICD-10-CM | POA: Diagnosis present

## 2018-04-29 DIAGNOSIS — F17210 Nicotine dependence, cigarettes, uncomplicated: Secondary | ICD-10-CM | POA: Diagnosis not present

## 2018-04-30 ENCOUNTER — Other Ambulatory Visit: Payer: Self-pay

## 2018-04-30 ENCOUNTER — Encounter (HOSPITAL_COMMUNITY): Payer: Self-pay | Admitting: *Deleted

## 2018-04-30 LAB — CBC WITH DIFFERENTIAL/PLATELET
Abs Immature Granulocytes: 0.02 10*3/uL (ref 0.00–0.07)
Basophils Absolute: 0 10*3/uL (ref 0.0–0.1)
Basophils Relative: 0 %
EOS PCT: 3 %
Eosinophils Absolute: 0.3 10*3/uL (ref 0.0–0.5)
HCT: 44.2 % (ref 39.0–52.0)
Hemoglobin: 15.6 g/dL (ref 13.0–17.0)
Immature Granulocytes: 0 %
Lymphocytes Relative: 23 %
Lymphs Abs: 2 10*3/uL (ref 0.7–4.0)
MCH: 30.2 pg (ref 26.0–34.0)
MCHC: 35.3 g/dL (ref 30.0–36.0)
MCV: 85.7 fL (ref 80.0–100.0)
MONO ABS: 0.9 10*3/uL (ref 0.1–1.0)
MONOS PCT: 11 %
Neutro Abs: 5.3 10*3/uL (ref 1.7–7.7)
Neutrophils Relative %: 63 %
Platelets: 332 10*3/uL (ref 150–400)
RBC: 5.16 MIL/uL (ref 4.22–5.81)
RDW: 11.9 % (ref 11.5–15.5)
WBC: 8.6 10*3/uL (ref 4.0–10.5)
nRBC: 0 % (ref 0.0–0.2)

## 2018-04-30 LAB — COMPREHENSIVE METABOLIC PANEL
ALT: 23 U/L (ref 0–44)
AST: 17 U/L (ref 15–41)
Albumin: 4.7 g/dL (ref 3.5–5.0)
Alkaline Phosphatase: 56 U/L (ref 38–126)
Anion gap: 10 (ref 5–15)
BUN: 9 mg/dL (ref 6–20)
CO2: 21 mmol/L — ABNORMAL LOW (ref 22–32)
Calcium: 9.3 mg/dL (ref 8.9–10.3)
Chloride: 106 mmol/L (ref 98–111)
Creatinine, Ser: 0.91 mg/dL (ref 0.61–1.24)
GFR calc Af Amer: >60 mL/min (ref >60)
GFR calc non Af Amer: >60 mL/min (ref >60)
Glucose, Bld: 130 mg/dL — ABNORMAL HIGH (ref 70–99)
Potassium: 3.2 mmol/L — ABNORMAL LOW (ref 3.5–5.1)
Sodium: 137 mmol/L (ref 135–145)
Total Bilirubin: 0.8 mg/dL (ref 0.3–1.2)
Total Protein: 7.6 g/dL (ref 6.5–8.1)

## 2018-04-30 LAB — SALICYLATE LEVEL: Salicylate Lvl: <7 mg/dL (ref 2.8–30.0)

## 2018-04-30 LAB — ETHANOL: Alcohol, Ethyl (B): <10 mg/dL (ref <10)

## 2018-04-30 LAB — CK: CK TOTAL: 84 U/L (ref 49–397)

## 2018-04-30 LAB — ACETAMINOPHEN LEVEL: Acetaminophen (Tylenol), Serum: <10 ug/mL — ABNORMAL LOW (ref 10–30)

## 2018-04-30 MED ORDER — LORAZEPAM 2 MG/ML IJ SOLN: 1.0000 mg | Freq: Once | INTRAMUSCULAR | Status: DC

## 2018-04-30 MED ORDER — LORAZEPAM 2 MG/ML IJ SOLN
2.0000 mg | Freq: Once | INTRAMUSCULAR | Status: AC
  Administered 2018-04-30: 2 mg via INTRAVENOUS
  Filled 2018-04-30 (×2): qty 1

## 2018-04-30 MED ORDER — SODIUM CHLORIDE 0.9 % IV BOLUS (SEPSIS)
1000.0000 mL | Freq: Once | INTRAVENOUS | Status: AC
  Administered 2018-04-30: 1000 mL via INTRAVENOUS

## 2018-04-30 NOTE — ED Notes (Signed)
This nurse had pulled up ativan in syringe to administer- attached to IV connector and pt pulled off syringe and medicine wasted on floor. Juliette AlcideMelinda, RN charge nurse aware. Richard, Engineer, materialssecurity officer at bedside now talking with pt. Pt is very paranoid, anxious, and delusional. Pt is not displaying violence or verbal abuse at this time, just very paranoid.

## 2018-04-30 NOTE — ED Triage Notes (Signed)
Pt c/o "bleeding and I am dying" pt admits to using meth tonight and having anxiety problems, no bleeding noted,

## 2018-04-30 NOTE — Discharge Instructions (Addendum)
Substance Abuse Treatment Programs ° °Intensive Outpatient Programs °[facility]     °[address]      °[place], [place]                   °[phone]      ° °[facility] °[address] °[place], [place] °[phone] ° °[facility]     °(Inpatient and outpatient)     °[address]           °[phone]   ° °[facility] °[phone] (Suboxone and Methadone) ° °[address]      °[place], [place] [address]      °336-882-2125      ° °[address] °[place], [place] °852-3033 ° °[facility] (Outpatient/Inpatient, Chemical)    °(insurance only) [phone]      °       °[facility] (Groups & Residential) °[place], [place] °[phone] ° °   °[facility]     °[address]     °[place], [place]      °[phone]      ° °[facility] (for caregivers and family) °[address] °[place], [place] °336-299-4655 ° ° ° ° ° °Residential Treatment Programs °[facility]      °[address], [place], [place] [address]  °([phone]      ° °[facility] °[address] [place], [place] [address] °[phone] ° °[facility]        °336-248-8914      ° °[facility] °1-800-659-3381 ° °[facility]             °[address]                                         °[place], [place]                                                °877-615-2722 or 336-784-9470                              ° °[facility] [place] °[address] °[place], [address] °1.877.941.8954 ° °[facility]    °[address]      °[place], [place]     °[phone]      ° °[facility] °[address] °[place], [place] °336-285-9073 ° °[facility]   °[address]     °[place], [place] [address]     °[phone]      °Admissions: 8am-3pm M-F ° °[facility] °[address] °[place],  [place] °336-227-7417 ° °[facility]: Residential Program (90 Days)   °[place], [place]      °336-725-8389 or 800-758-6077    ° °[facility] °[place],  °(Walk in Hours over the weekend or by referral) ° °[facility] °[address], [place],  [address] °([phone] ° °Crisis Mobile: [facility]:  1-877-626-1772 (for crisis response 24 hours a day) °[facility] Hotline:      1-800-256-2452 °Outpatient Psychiatry and Counseling ° °[facility]: Mobile Crisis   Management 24 hours:  1-877-626-1772 ° °[facility] sliding scale fee and walk in schedule: M-F 8am-12pm/1pm-3pm °[address]  °[place], [place] [address] °[phone] ° °[facility] °[address] °[place], [place] [address] °336-703-9650 ° °[facility] (Formerly known [facility])- new patient walk-in appointments available Monday - Friday 8am -3pm.          °[address] °[place], [place] [address] °336-676-6840 or crisis line- 336-676-6905 ° ° [facility] Intensive Outpatient Therapy Program °[address] °[place], [place] [address] °336-832-9804 ° °[facility]                  °Crisis Services      °336.641.4993      °[address]     °[place], [place] [address]                ° °[facility]   °[facility] °[phone] °[address] °[place], [place] [address] ° ° °[facility]          °[address],  °[place], [place] [address]       °([phone] ° °[facility] °[address] °[place], [place] [address] °336-292-1510 ° °[facility]    °[address]    °[place], [place] [address]     °[phone]      ° °Parish McKinney, MD     °[address]     °[place] [address]     °336-282-1251     °  °[facility] °[address] °[place] [address] ° °[facility]     °[address]     °[place], [place]      °336-542-2076      ° °[facility] °Shamsher Ahluwalia, MD °[address] °[place], [place] [address] °[phone] ° °[facility]     °[address]     °[place] [address]     °336-274-1237      ° °[facility] °[address] °[place], [place] [address] °336-854-4450 °Resources for Temporary Residential Assistance/Crisis Centers ° °DAY CENTERS °[facility] °M-F 8am-3pm   °[address] [place], [place] [address]   336-332-0824 °Services include: laundry, barbering, support groups, case management, phone  & computer access, showers, AA/NA mtgs, mental health/substance abuse nurse, job skills class, disability information, VA assistance, spiritual classes, etc.  ° °HOMELESS SHELTERS ° °[facility]     °[facility]   °[address], [place]     °336.271.5959       °       °[facility] (women and children)       °[address] °[place], [place] [address] °336-275-0820 °[email or web address] for application and process °Application Required ° °[facility]   °[address]    °[place] [address]     °[phone]       °             °[facility] °[address] °, [place] [address] °336.273.5572 °336-235-0363(schedule application appt.) °Application Required ° °[facility] (women only)    °[address]     °[place], [place] [address]     °336-884-1039      °  Intake starts 6pm daily °Need valid ID, SSC, & Police report °[facility] [place] °[address] °[place], [place] °336-881-5420 °Application Required ° °[facility] (men only)     °[address]      °[place], [place]     °[phone]      ° °[facility] °(Pregnant women only) °[address] °, [place] °336-275-0206 ° °[facility]      °[address]      °[place], [place] [address]     °[phone]      °       °[facility] °[address] °[place], [place] °336-723-1848 °90 day commitment/SA/Application process ° °[facility](men only)     °[address]     °[place], [place]     °[phone]       °Check-in at 7pm     °       °[facility] °[address] °[place], [place] [address] °336-248-6684 °Men/Women/Women and Children must be there by 7 pm ° °[facility] °[place], [place] °336-722-8721                ° °

## 2018-04-30 NOTE — ED Provider Notes (Signed)
[facility] EMERGENCY DEPARTMENT Provider Note   CSN: [id number] Arrival date & time: 04/29/18  2355     History   Chief Complaint Chief Complaint  Patient presents with  . Panic Attack   Level 5 caveat due to psychosis HPI Scott Huber is a 27 y.o. male.  The history is provided by the patient. The history is limited by the condition of the patient.  Mental Health Problem  Presenting symptoms: hallucinations and paranoid behavior   Degree of incapacity (severity):  Severe Onset quality:  Sudden Timing:  Constant Progression:  Worsening Chronicity:  New Context: drug abuse   Relieved by:  Nothing Worsened by:  Drugs pt with history of ADHD, anxiety, drug abuse presents with hallucinations and agitation. Patient told nursing that he thought he was bleeding and dying.  He reports he has been shot in the head.  He keeps asking me to not let him die. Past Medical History:  Diagnosis Date  . ADHD   . Anxiety   . Bipolar 1 disorder (HCC)   . Genital herpes   . Panic attack   . Stimulant abuse (HCC)     There are no active problems to display for this patient.   History reviewed. No pertinent surgical history.      Home Medications    Prior to Admission medications   Medication Sig Start Date End Date Taking? Authorizing Provider  oxyCODONE-acetaminophen (PERCOCET) 5-325 MG tablet Take 1-2 tablets by mouth every 8 (eight) hours as needed for severe pain. 12/14/17   Mesner, Barbara CowerJason, MD  potassium chloride SA (K-DUR,KLOR-CON) 20 MEQ tablet Take 1 tablet (20 mEq total) by mouth 2 (two) times daily for 3 days. 03/10/18 03/13/18  Pricilla LovelessGoldston, Scott, MD    Family History No family history on file.  Social History Social History   Tobacco Use  . Smoking status: Current Every Day Smoker    Packs/day: 1.00    Types: Cigarettes  . Smokeless tobacco: Never Used  Substance Use Topics  . Alcohol use: Yes    Comment: occ  . Drug use: Yes    Types: Cocaine,  Methamphetamines    Comment: heroine      Allergies   Hydrocodone and Vistaril [hydroxyzine hcl]   Review of Systems Review of Systems  Unable to perform ROS: Psychiatric disorder  Psychiatric/Behavioral: Positive for hallucinations and paranoia.     Physical Exam Updated Vital Signs BP (!) 128/92   Pulse (!) 126   Temp 97.7 F (36.5 C) (Oral)   Resp (!) 23   Ht 1.753 m (5\' 9" )   Wt 83 kg   SpO2 100%   BMI 27.02 kg/m   Physical Exam CONSTITUTIONAL: Disheveled, anxious HEAD: Normocephalic/atraumatic EYES: EOMI/PERRL, pupils dilated ENMT: Mucous membranes dry NECK: supple no meningeal signs SPINE/BACK:entire spine nontender CV: S1/S2 noted, no murmurs/rubs/gallops noted, tachycardia LUNGS: Lungs are clear to auscultation bilaterally, no apparent distress ABDOMEN: soft, nontender, no rebound or guarding, bowel sounds noted throughout abdomen GU:no cva tenderness NEURO: Pt is awake/alert, moves all extremitiesx4.  No facial droop.   No Focal motor deficits noted. EXTREMITIES: pulses normal/equal, full ROM SKIN: warm, color normal, no wounds noted to his body PSYCH: Agitated and anxious, patient appears to be hallucinating  ED Treatments / Results  Labs (all labs ordered are listed, but only abnormal results are displayed) Labs Reviewed  COMPREHENSIVE METABOLIC PANEL - Abnormal; Notable for the following components:      Result Value   Potassium 3.2 (*)  CO2 21 (*)    Glucose, Bld 130 (*)    All other components within normal limits  ACETAMINOPHEN LEVEL - Abnormal; Notable for the following components:   Acetaminophen (Tylenol), Serum <10 (*)    All other components within normal limits  CBC WITH DIFFERENTIAL/PLATELET  CK  ETHANOL  SALICYLATE LEVEL  RAPID URINE DRUG SCREEN, HOSP PERFORMED    EKG EKG Interpretation  Date/Time:  Sunday April 30 2018 00:08:35 EST Ventricular Rate:  110 PR Interval:    QRS Duration: 92 QT Interval:  327 QTC  Calculation: 443 R Axis:   -80 Text Interpretation:  Sinus tachycardia Probable left atrial enlargement Left axis deviation Abnrm T, consider ischemia, anterolateral lds Confirmed by Keifer Habib ([id number]) on 04/30/2018 12:26:03 AM   Radiology No results found.  Procedures Procedures  Medications Ordered in ED Medications  LORazepam (ATIVAN) injection 1 mg (0 mg Intravenous Hold 04/30/18 0124)  sodium chloride 0.9 % bolus 1,000 mL (0 mLs Intravenous Stopped 04/30/18 0123)  LORazepam (ATIVAN) injection 2 mg (2 mg Intravenous Given 04/30/18 0035)     Initial Impression / Assessment and Plan / ED Course  I have reviewed the triage vital signs and the nursing notes.  Pertinent labs & imaging results that were available during my care of the patient were reviewed by me and considered in my medical decision making (see chart for details).     12 :29 AM Patient with known history of meth abuse presents with agitation and hallucinations.  He reports he has not slept in up to 2 days.  He thinks he has been shot in the head There Is no evidence of any gunshot wounds. This is likely stimulant induced psychosis. Medications, fluids and labs have been ordered 1:48 AM Patient is now resting comfortably.  Vital signs are improved. BP 118/84   Pulse (!) 107   Temp 97.7 F (36.5 C) (Oral)   Resp 15   Ht 1.753 m (5\' 9" )   Wt 83 kg   SpO2 99%   BMI 27.02 kg/m  Labs overall reassuring.  We will allow patient to rest and reassess.   Patient slept for several hours and appears improved. He is ambulatory.  He has a ride home.  He is no longer exhibiting psychotic features, suspect this was transient due to methamphetamine abuse Final Clinical Impressions(s) / ED Diagnoses   Final diagnoses:  Substance abuse [facility](HCC)    ED Discharge Orders    None       Zadie RhineWickline, Nohemy Koop, MD 04/30/18 [phone]

## 2018-04-30 NOTE — ED Notes (Signed)
Pt woke up to assess- pt alert and oriented x 4. Pt ambulatory with steady gait. Pt girlfriend called and pt is talking with her now. Pt friend in lobby waiting on pt. Dr Bebe ShaggyWickline made aware of this.

## 2018-04-30 NOTE — ED Notes (Signed)
Pt given outpatient resource list of rehab centers. Pt unable to sign e-sig due to computer issues.

## 2018-05-13 ENCOUNTER — Emergency Department
Admission: EM | Admit: 2018-05-13 | Discharge: 2018-05-14 | Disposition: A | Discharge status: Home / Self Care | Payer: Medicaid Other | Attending: Emergency Medicine | Admitting: Emergency Medicine

## 2018-05-13 DIAGNOSIS — F17210 Nicotine dependence, cigarettes, uncomplicated: Secondary | ICD-10-CM | POA: Diagnosis not present

## 2018-05-13 DIAGNOSIS — R442 Other hallucinations: Secondary | ICD-10-CM | POA: Insufficient documentation

## 2018-05-13 DIAGNOSIS — Z046 Encounter for general psychiatric examination, requested by authority: Secondary | ICD-10-CM | POA: Diagnosis present

## 2018-05-13 DIAGNOSIS — F1910 Other psychoactive substance abuse, uncomplicated: Secondary | ICD-10-CM | POA: Diagnosis not present

## 2018-05-13 LAB — COMPREHENSIVE METABOLIC PANEL
ALT: 17 U/L (ref 0–44)
AST: 23 U/L (ref 15–41)
Albumin: 5.4 g/dL — ABNORMAL HIGH (ref 3.5–5.0)
Alkaline Phosphatase: 62 U/L (ref 38–126)
Anion gap: 12 (ref 5–15)
BUN: 15 mg/dL (ref 6–20)
CALCIUM: 9.9 mg/dL (ref 8.9–10.3)
CO2: 23 mmol/L (ref 22–32)
Chloride: 100 mmol/L (ref 98–111)
Creatinine, Ser: 1.28 mg/dL — ABNORMAL HIGH (ref 0.61–1.24)
GFR calc Af Amer: >60 mL/min (ref >60)
GFR calc non Af Amer: >60 mL/min (ref >60)
Glucose, Bld: 100 mg/dL — ABNORMAL HIGH (ref 70–99)
Potassium: 4.4 mmol/L (ref 3.5–5.1)
Sodium: 135 mmol/L (ref 135–145)
Total Bilirubin: 1.4 mg/dL — ABNORMAL HIGH (ref 0.3–1.2)
Total Protein: 8.5 g/dL — ABNORMAL HIGH (ref 6.5–8.1)

## 2018-05-13 LAB — SALICYLATE LEVEL: Salicylate Lvl: <7 mg/dL (ref 2.8–30.0)

## 2018-05-13 LAB — ETHANOL: Alcohol, Ethyl (B): <10 mg/dL (ref <10)

## 2018-05-13 LAB — CBC
HCT: 47.7 % (ref 39.0–52.0)
Hemoglobin: 17.2 g/dL — ABNORMAL HIGH (ref 13.0–17.0)
MCH: 31.3 pg (ref 26.0–34.0)
MCHC: 36.1 g/dL — ABNORMAL HIGH (ref 30.0–36.0)
MCV: 86.9 fL (ref 80.0–100.0)
Platelets: 163 10*3/uL (ref 150–400)
RBC: 5.49 MIL/uL (ref 4.22–5.81)
RDW: 12.5 % (ref 11.5–15.5)
WBC: 12.1 10*3/uL — ABNORMAL HIGH (ref 4.0–10.5)
nRBC: 0 % (ref 0.0–0.2)

## 2018-05-13 LAB — ACETAMINOPHEN LEVEL: Acetaminophen (Tylenol), Serum: <10 ug/mL — ABNORMAL LOW (ref 10–30)

## 2018-05-13 MED ORDER — NICOTINE 21 MG/24HR TD PT24
21.0000 mg | MEDICATED_PATCH | Freq: Once | TRANSDERMAL | Status: DC
  Administered 2018-05-13: 21 mg via TRANSDERMAL
  Filled 2018-05-13: qty 1

## 2018-05-13 MED ORDER — LORAZEPAM 2 MG PO TABS
2.0000 mg | ORAL_TABLET | Freq: Once | ORAL | Status: AC
  Administered 2018-05-13: 2 mg via ORAL
  Filled 2018-05-13: qty 1

## 2018-05-13 NOTE — ED Provider Notes (Signed)
[facility] Emergency Department Provider Note   ____________________________________________   I have reviewed the triage vital signs and the nursing notes.   HISTORY  Chief Complaint Hallucinations   History limited by: Not Limited   HPI Scott Huber is a 28 y.o. male who presents to the emergency department today because of concern for abnormal behavior. Patient does come under IVC. Patient was found to be having hallucinations. Thought he was shot. The patient does admit to using meth today. States that cocaine is normally his drug of choice and that the cocaine has hit hard. He also is complaining of a lot of snot. He states that his nasal congestion started today. States he was recently seen at a hospital in [place].   Per medical record review patient has a history of bipolar, stimulant abuse  Past Medical History:  Diagnosis Date  . ADHD   . Anxiety   . Bipolar 1 disorder (HCC)   . Genital herpes   . Panic attack   . Stimulant abuse (HCC)     There are no active problems to display for this patient.   History reviewed. No pertinent surgical history.  Prior to Admission medications   Not on File    Allergies Hydrocodone and Vistaril [hydroxyzine hcl]  History reviewed. No pertinent family history.  Social History Social History   Tobacco Use  . Smoking status: Current Every Day Smoker    Packs/day: 1.00    Types: Cigarettes  . Smokeless tobacco: Never Used  Substance Use Topics  . Alcohol use: Yes    Comment: occ  . Drug use: Yes    Types: Cocaine, Methamphetamines    Comment: heroine     Review of Systems Constitutional: No fever/chills Eyes: No visual changes. ENT: Positive for nasal congestion.  Cardiovascular: Denies chest pain. Respiratory: Denies shortness of breath. Gastrointestinal: No abdominal pain.  No nausea, no vomiting.  No diarrhea.   Genitourinary: Negative for dysuria. Musculoskeletal: Negative for  back pain. Skin: Negative for rash. Neurological: Negative for headaches, focal weakness or numbness.  ____________________________________________   PHYSICAL EXAM:  VITAL SIGNS: ED Triage Vitals  Enc Vitals Group     BP 05/13/18 1803 (!) 154/131     Pulse Rate 05/13/18 1803 (!) 127     Resp 05/13/18 1803 (!) 24     Temp 05/13/18 1803 (!) 97.5 F (36.4 C)     Temp Source 05/13/18 1803 Oral     SpO2 05/13/18 1803 100 %     Weight --      Height --      Head Circumference --      Peak Flow --      Pain Score 05/13/18 1827 0   Constitutional: Awake and alert. Emotionally labile.  Eyes: Conjunctivae are normal.  ENT      Head: Normocephalic and atraumatic.      Nose: No congestion/rhinnorhea.      Mouth/Throat: Mucous membranes are moist.      Neck: No stridor. Hematological/Lymphatic/Immunilogical: No cervical lymphadenopathy. Cardiovascular: Normal rate, regular rhythm.  No murmurs, rubs, or gallops.  Respiratory: Normal respiratory effort without tachypnea nor retractions. Breath sounds are clear and equal bilaterally. No wheezes/rales/rhonchi. Gastrointestinal: Soft and non tender. No rebound. No guarding.  Genitourinary: Deferred Musculoskeletal: Normal range of motion in all extremities. No lower extremity edema. Neurologic:  Pressured speech. Moving all extremities.  Skin:  Skin is warm, dry and intact. No rash noted. Psychiatric: Emotionally labile.  ____________________________________________    LABS (pertinent positives/negatives)  Acetaminophen, ethanol and salicylate below threshold CBC wbc 12.1, hgb 17.2, plt 163 CMP na 135, k 4.4, glu 100, cr 1.28  ____________________________________________   EKG  None  ____________________________________________    RADIOLOGY  None  ____________________________________________   PROCEDURES  Procedures  ____________________________________________   INITIAL IMPRESSION / ASSESSMENT AND PLAN / ED  COURSE  Pertinent labs & imaging results that were available during my care of the patient were reviewed by me and considered in my medical decision making (see chart for details).   Patient brought to the emergency department under IVC today after doing drugs.  Patient does appear to be under the influence of drugs.  Will have SOC evaluate.   ____________________________________________   FINAL CLINICAL IMPRESSION(S) / ED DIAGNOSES  Final diagnoses:  Polysubstance abuse (HCC)     Note: This dictation was prepared with Dragon dictation. Any transcriptional errors that result from this process are unintentional     Phineas Semen, MD 05/14/18 [phone]

## 2018-05-13 NOTE — ED Notes (Signed)
Pt becoming more restless and asking to leave. Pt talking loudly asking to go out and smoke. Explained to pt that we are a smoke free facility. Offered pt nicotine patch but he continues to say "if you can do that just let me go out and smoke". Dr Derrill Kay informed and will give orders for anxiety medication and nicotine patch.

## 2018-05-13 NOTE — ED Notes (Signed)
BEHAVIORAL HEALTH ROUNDING Patient sleeping: No. Patient alert and oriented: yes Behavior appropriate:  Intoxicated  - cooperative    ; If no, describe:  Nutrition and fluids offered: yes Toileting and hygiene offered: Yes  Sitter present: q15 minute observations and security monitoring Law enforcement present: Yes  ODS  ENVIRONMENTAL ASSESSMENT Potentially harmful objects out of patient reach: Yes.   Personal belongings secured: Yes.   Patient dressed in hospital provided attire only: Yes.   Plastic bags out of patient reach: Yes.   Patient care equipment (cords, cables, call bells, lines, and drains) shortened, removed, or accounted for: Yes.   Equipment and supplies removed from bottom of stretcher: Yes.   Potentially toxic materials out of patient reach: Yes.   Sharps container removed or out of patient reach: Yes.

## 2018-05-13 NOTE — ED Notes (Addendum)
Denies SI or HI 
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
## 2018-05-13 NOTE — ED Notes (Signed)
Called for [facility] consult [phone]

## 2018-05-14 NOTE — ED Notes (Signed)
Received call from Girlfriend and informed her pt was d/c. She states she is on the way to pick pt up.

## 2018-05-14 NOTE — Discharge Instructions (Signed)
You were seen in the emergency department for symptoms related to your drug use.  Please avoid drug and alcohol use and follow-up at any of the facilities listed in this paperwork.

## 2018-05-14 NOTE — ED Notes (Signed)
Pt was given cell phone back as he is waiting on gf for ride.

## 2018-05-14 NOTE — ED Notes (Signed)
Report given to Dr Orpah Clinton from [facility].

## 2018-05-23 ENCOUNTER — Encounter (HOSPITAL_COMMUNITY): Payer: Self-pay

## 2018-05-23 ENCOUNTER — Emergency Department (HOSPITAL_COMMUNITY)
Admission: EM | Admit: 2018-05-23 | Discharge: 2018-05-23 | Disposition: A | Discharge status: Home / Self Care | Payer: Medicaid Other | Attending: Emergency Medicine | Admitting: Emergency Medicine

## 2018-05-23 ENCOUNTER — Other Ambulatory Visit: Payer: Self-pay | Admitting: Family

## 2018-05-23 ENCOUNTER — Emergency Department (HOSPITAL_COMMUNITY)
Admission: EM | Admit: 2018-05-23 | Discharge: 2018-05-23 | Disposition: A | Discharge status: Home / Self Care | Payer: Self-pay

## 2018-05-23 ENCOUNTER — Emergency Department (HOSPITAL_COMMUNITY): Payer: Medicaid Other

## 2018-05-23 DIAGNOSIS — F17210 Nicotine dependence, cigarettes, uncomplicated: Secondary | ICD-10-CM | POA: Insufficient documentation

## 2018-05-23 DIAGNOSIS — Z5320 Procedure and treatment not carried out because of patient's decision for unspecified reasons: Secondary | ICD-10-CM | POA: Diagnosis not present

## 2018-05-23 DIAGNOSIS — F319 Bipolar disorder, unspecified: Secondary | ICD-10-CM | POA: Diagnosis not present

## 2018-05-23 DIAGNOSIS — F1910 Other psychoactive substance abuse, uncomplicated: Secondary | ICD-10-CM | POA: Insufficient documentation

## 2018-05-23 DIAGNOSIS — R462 Strange and inexplicable behavior: Secondary | ICD-10-CM

## 2018-05-23 DIAGNOSIS — Z041 Encounter for examination and observation following transport accident: Secondary | ICD-10-CM | POA: Insufficient documentation

## 2018-05-23 DIAGNOSIS — F909 Attention-deficit hyperactivity disorder, unspecified type: Secondary | ICD-10-CM | POA: Insufficient documentation

## 2018-05-23 LAB — CBC WITH DIFFERENTIAL/PLATELET
Abs Immature Granulocytes: 0.03 10*3/uL (ref 0.00–0.07)
BASOS ABS: 0 10*3/uL (ref 0.0–0.1)
Basophils Relative: 1 %
Eosinophils Absolute: 0.2 10*3/uL (ref 0.0–0.5)
Eosinophils Relative: 3 %
HCT: 42.1 % (ref 39.0–52.0)
Hemoglobin: 14.9 g/dL (ref 13.0–17.0)
Immature Granulocytes: 0 %
Lymphocytes Relative: 23 %
Lymphs Abs: 1.7 10*3/uL (ref 0.7–4.0)
MCH: 31.2 pg (ref 26.0–34.0)
MCHC: 35.4 g/dL (ref 30.0–36.0)
MCV: 88.1 fL (ref 80.0–100.0)
Monocytes Absolute: 0.7 10*3/uL (ref 0.1–1.0)
Monocytes Relative: 9 %
Neutro Abs: 4.9 10*3/uL (ref 1.7–7.7)
Neutrophils Relative %: 64 %
Platelets: 347 10*3/uL (ref 150–400)
RBC: 4.78 MIL/uL (ref 4.22–5.81)
RDW: 11.9 % (ref 11.5–15.5)
WBC: 7.6 10*3/uL (ref 4.0–10.5)
nRBC: 0 % (ref 0.0–0.2)

## 2018-05-23 LAB — COMPREHENSIVE METABOLIC PANEL
ALBUMIN: 4.3 g/dL (ref 3.5–5.0)
ALT: 13 U/L (ref 0–44)
AST: 14 U/L — ABNORMAL LOW (ref 15–41)
Alkaline Phosphatase: 42 U/L (ref 38–126)
Anion gap: 8 (ref 5–15)
BUN: 10 mg/dL (ref 6–20)
CO2: 23 mmol/L (ref 22–32)
Calcium: 9.1 mg/dL (ref 8.9–10.3)
Chloride: 109 mmol/L (ref 98–111)
Creatinine, Ser: 1.13 mg/dL (ref 0.61–1.24)
GFR calc Af Amer: >60 mL/min (ref >60)
GFR calc non Af Amer: >60 mL/min (ref >60)
GLUCOSE: 86 mg/dL (ref 70–99)
Potassium: 3.9 mmol/L (ref 3.5–5.1)
Sodium: 140 mmol/L (ref 135–145)
TOTAL PROTEIN: 6.6 g/dL (ref 6.5–8.1)
Total Bilirubin: 0.6 mg/dL (ref 0.3–1.2)

## 2018-05-23 LAB — ETHANOL: Alcohol, Ethyl (B): <10 mg/dL (ref <10)

## 2018-05-23 LAB — I-STAT TROPONIN, ED: Troponin i, poc: 0 ng/mL (ref 0.00–0.08)

## 2018-05-23 MED ORDER — SODIUM CHLORIDE 0.9 % IV BOLUS
1000.0000 mL | Freq: Once | INTRAVENOUS | Status: AC
  Administered 2018-05-23: 1000 mL via INTRAVENOUS
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
## 2018-05-23 NOTE — ED Notes (Signed)
Patient eloped, stated that a ride was coming. Thought was convinced to stay but ambulated to waiting room

## 2018-05-23 NOTE — ED Notes (Signed)
BH assessment being completed at bedside.
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
## 2018-05-24 ENCOUNTER — Encounter (HOSPITAL_COMMUNITY): Payer: Self-pay | Admitting: Emergency Medicine

## 2018-05-24 ENCOUNTER — Emergency Department (HOSPITAL_COMMUNITY)
Admission: EM | Admit: 2018-05-24 | Discharge: 2018-05-25 | Disposition: A | Discharge status: Home / Self Care | Payer: Medicaid Other | Attending: Emergency Medicine | Admitting: Emergency Medicine

## 2018-05-24 DIAGNOSIS — R451 Restlessness and agitation: Secondary | ICD-10-CM | POA: Diagnosis present

## 2018-05-24 DIAGNOSIS — F1590 Other stimulant use, unspecified, uncomplicated: Secondary | ICD-10-CM | POA: Diagnosis not present

## 2018-05-24 DIAGNOSIS — Z79899 Other long term (current) drug therapy: Secondary | ICD-10-CM | POA: Diagnosis not present

## 2018-05-24 DIAGNOSIS — F319 Bipolar disorder, unspecified: Secondary | ICD-10-CM | POA: Diagnosis not present

## 2018-05-24 DIAGNOSIS — F17210 Nicotine dependence, cigarettes, uncomplicated: Secondary | ICD-10-CM | POA: Insufficient documentation

## 2018-05-24 LAB — COMPREHENSIVE METABOLIC PANEL
ALBUMIN: 4.8 g/dL (ref 3.5–5.0)
ALT: 13 U/L (ref 0–44)
AST: 19 U/L (ref 15–41)
Alkaline Phosphatase: 43 U/L (ref 38–126)
Anion gap: 12 (ref 5–15)
BILIRUBIN TOTAL: 1 mg/dL (ref 0.3–1.2)
BUN: 12 mg/dL (ref 6–20)
CO2: 19 mmol/L — ABNORMAL LOW (ref 22–32)
Calcium: 9.1 mg/dL (ref 8.9–10.3)
Chloride: 108 mmol/L (ref 98–111)
Creatinine, Ser: 1.08 mg/dL (ref 0.61–1.24)
GFR calc Af Amer: >60 mL/min (ref >60)
GFR calc non Af Amer: >60 mL/min (ref >60)
GLUCOSE: 119 mg/dL — AB (ref 70–99)
Potassium: 3.1 mmol/L — ABNORMAL LOW (ref 3.5–5.1)
Sodium: 139 mmol/L (ref 135–145)
Total Protein: 7.3 g/dL (ref 6.5–8.1)

## 2018-05-24 LAB — ACETAMINOPHEN LEVEL: Acetaminophen (Tylenol), Serum: <10 ug/mL — ABNORMAL LOW (ref 10–30)

## 2018-05-24 LAB — CBC WITH DIFFERENTIAL/PLATELET
Abs Immature Granulocytes: 0.02 10*3/uL (ref 0.00–0.07)
Basophils Absolute: 0 10*3/uL (ref 0.0–0.1)
Basophils Relative: 1 %
Eosinophils Absolute: 0.1 10*3/uL (ref 0.0–0.5)
Eosinophils Relative: 2 %
HEMATOCRIT: 41.1 % (ref 39.0–52.0)
Hemoglobin: 14.2 g/dL (ref 13.0–17.0)
Immature Granulocytes: 0 %
Lymphocytes Relative: 20 %
Lymphs Abs: 1.3 10*3/uL (ref 0.7–4.0)
MCH: 30.6 pg (ref 26.0–34.0)
MCHC: 34.5 g/dL (ref 30.0–36.0)
MCV: 88.6 fL (ref 80.0–100.0)
Monocytes Absolute: 0.6 10*3/uL (ref 0.1–1.0)
Monocytes Relative: 9 %
NRBC: 0 % (ref 0.0–0.2)
Neutro Abs: 4.5 10*3/uL (ref 1.7–7.7)
Neutrophils Relative %: 68 %
Platelets: 251 10*3/uL (ref 150–400)
RBC: 4.64 MIL/uL (ref 4.22–5.81)
RDW: 12.5 % (ref 11.5–15.5)
WBC: 6.5 10*3/uL (ref 4.0–10.5)

## 2018-05-24 LAB — RAPID URINE DRUG SCREEN, HOSP PERFORMED
Amphetamines: POSITIVE — AB
Barbiturates: NOT DETECTED
Benzodiazepines: POSITIVE — AB
Cocaine: NOT DETECTED
Opiates: NOT DETECTED
Tetrahydrocannabinol: NOT DETECTED

## 2018-05-24 LAB — ETHANOL: Alcohol, Ethyl (B): <10 mg/dL (ref <10)

## 2018-05-24 LAB — CK: CK TOTAL: 139 U/L (ref 49–397)

## 2018-05-24 LAB — SALICYLATE LEVEL: Salicylate Lvl: <7 mg/dL (ref 2.8–30.0)

## 2018-05-24 MED ORDER — LORAZEPAM 2 MG/ML IJ SOLN
2.0000 mg | Freq: Once | INTRAMUSCULAR | Status: AC
  Administered 2018-05-24: 2 mg via INTRAMUSCULAR

## 2018-05-24 MED ORDER — ZIPRASIDONE MESYLATE 20 MG IM SOLR
INTRAMUSCULAR | Status: AC
  Administered 2018-05-24: 20 mg via INTRAMUSCULAR
  Filled 2018-05-24: qty 20

## 2018-05-24 MED ORDER — STERILE WATER FOR INJECTION IJ SOLN
INTRAMUSCULAR | Status: AC
  Administered 2018-05-24: 1.2 mL
  Filled 2018-05-24: qty 10

## 2018-05-24 MED ORDER — LORAZEPAM 2 MG/ML IJ SOLN
INTRAMUSCULAR | Status: AC
  Administered 2018-05-24: 2 mg via INTRAMUSCULAR
  Filled 2018-05-24: qty 1

## 2018-05-24 MED ORDER — ZIPRASIDONE MESYLATE 20 MG IM SOLR
20.0000 mg | Freq: Once | INTRAMUSCULAR | Status: AC
  Administered 2018-05-24: 20 mg via INTRAMUSCULAR

## 2018-05-24 NOTE — ED Notes (Signed)
Pt remains asleep

## 2018-05-24 NOTE — ED Notes (Signed)
Sheriffs dept signed off

## 2018-05-24 NOTE — ED Notes (Signed)
Pt laying in bed with covers over head

## 2018-05-24 NOTE — ED Notes (Signed)
Offered pt supper tray and offered toileting. Pt states "Im good
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
## 2018-05-24 NOTE — ED Notes (Signed)
In and out attempted, pt kicked and curled up. Unable to complete. Dr. Criss Alvine aware.

## 2018-05-24 NOTE — Progress Notes (Signed)
Per Donell Sievert, PA; Pt recommended for inpatient treatment based on previous assessment and lab results. AC, Kim informed of pt disposition.   Raynelle Fanning, RN informed of pt disposition at 23:13.
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
## 2018-05-24 NOTE — ED Notes (Signed)
Offered pt his supper tray and Pulled his cover his hand

## 2018-05-24 NOTE — Progress Notes (Signed)
CSW requested UDS prior to [facility] Physician Extender re-assessing patient.  Scott Huber. Kaylyn Lim, MSW, LCSWA Disposition Clinical Social Work [phone] (cell) [phone] (office)
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
## 2018-05-24 NOTE — ED Notes (Signed)
BH made aware the patient is alert enough to be seen by psychiatric provider.

## 2018-05-25 ENCOUNTER — Other Ambulatory Visit: Payer: Self-pay

## 2018-05-25 NOTE — ED Notes (Signed)
Pt given crackers and water. 

## 2018-05-25 NOTE — ED Notes (Signed)
Pt recommended for inpatient. Sitter remains at bedside.

## 2018-05-25 NOTE — Discharge Instructions (Addendum)
Substance Abuse Treatment Programs ° °Intensive Outpatient Programs °[facility]     °[address]      °[place], [place]                   °[phone]      ° °[facility] °[address] °[place], [place] °[phone] ° °[facility]     °(Inpatient and outpatient)     °[address]           °[phone]   ° °[facility] °[phone] (Suboxone and Methadone) ° °[address]      °[place], [place] [address]      °[phone]      ° °[address] °[place], [place] °[phone] ° °[facility] (Outpatient/Inpatient, Chemical)    °(insurance only) [phone]      °       °[facility] (Groups & Residential) °[place], [place] °[phone] ° °   °[facility]     °[address]     °[place], [place]      °[phone]      ° °[facility] (for caregivers and family) °[address] °[place], [place] °[phone] ° ° ° ° ° °Residential Treatment Programs °[facility]      °[address], [place], [place] [address]  °[phone]      ° °[facility] °[address] [place], [place] [address] °[phone] ° °[facility]        °[phone]      ° °[facility] °[phone] ° °[facility]             °[address]                                         °[place], [place]                                                °[phone] or [phone]                              ° °[facility] [place] °[address] °[place], [address] °[phone] ° °[facility]    °[address]      °, [place]     °[phone]      ° °[facility] °[address] °, [place] °[phone] ° °[facility]   °[address]     °[place], [place] [address]     °[phone]      °Admissions: 8am-3pm M-F ° °[facility] °[address] °[place],  [place] °[phone] ° °[facility]: Residential Program (90 Days)   °[place], [place]      °[phone] or [phone]    ° °[facility] °[place], [place] °(Walk in Hours over the weekend or by referral) ° °[facility] °[address], [place], [place] [address] °([phone] ° °Crisis Mobile: [facility]:  1-877-626-1772 (for crisis response 24 hours a day) °[facility] Hotline:      1-800-256-2452 °Outpatient Psychiatry and Counseling ° °[facility]: Mobile Crisis   Management 24 hours:  1-579-867-5796  [facility] of the [employer] sliding scale fee and walk in schedule: M-F 8am-12pm/1pm-3pm [address]  [place], [place] [address] [place], [place] [address] [phone]  [facility] ([facility] [employer])- new patient walk-in appointments available Monday - Friday 8am -3pm.          [address] [place], [place] [address] [phone] or crisis line- [facility]/ Intensive Outpatient Therapy Program [place], [place] [address] [place]      [phone] [place] [place], [place] [address]                 [place]   [facility] [phone] [place], [place] [address]   [employer] [facility] Care          [address] Johnette Abraham  [place], [place] [address]       [phone]  [facility] [address] [place], [place] [address] [phone]  [facility]    [address] [place], [place] [address]     [place], [place] Joycelyn Man     [place] [address]     [phone]       [facility] [place] [address]  [facility]     [address], [place], [place], [place] [address] [place]     [address]     [place], [place] [address]     [phone]       [facility] [address] [place], [place] [address] [phone] Resources for Temporary Residential Assistance/Crisis [facility]) M-F 8am-3pm   [address] [place], [place] [address]   [phone] Services include: laundry, barbering, support groups, case management, phone  & computer access, showers, AA/NA mtgs, mental health/substance abuse nurse, job skills class, disability information, VA assistance, spiritual classes, etc.   [facility]   [address], [place]     [place]              [employer] (women and children)       [place]. [place], [place] [address] [phone] [email or web address] for application and process Application Required  [facility]   [address]    [place] [address]     [phone]                    [place],  [address] [phone](OILNZVJK application appt.) Application Required  [facility] (women only)    [address]     [place],  [address]     [phone]  Intake starts 6pm daily Need valid ID, SSC, & Police report [employer] [address] [place], [place] [phone] Application Required  [facility] [facility] (men only)     [address]      [place], [place]     [phone]       [facility] (Pregnant women only) [address] [place], [place] [phone]  [facility]      [address]      [place], [place] [address]     [phone]             [facility] [address] [place], [place] [phone] 90 day commitment/SA/Application process  [facility](men only)     [address]     [place], [place]     [phone]       Check-in at [facility] of [facility] [address] [place], [place] [address] [phone] Men/Women/Women and Children must be there by 7 pm  [facility] [place], [place] [phone]                  Take your usual prescriptions as previously directed.  Call your regular medical doctor today to schedule a follow up appointment within the next week. Call the outpatient resources today if you are interested in detox programs. Return to the Emergency Department immediately sooner if worsening.

## 2018-05-25 NOTE — ED Notes (Signed)
Pts father and girlfriend in to request keys. Pt gave me permission for keys to be given to family

## 2018-05-25 NOTE — ED Notes (Signed)
Pt resting.

## 2018-05-25 NOTE — BHH Counselor (Signed)
Assunta Found, NP signed note stating patient is psych cleared for discharge. Note was started yesterday, 05/24/2018 so will be listed under that "Date of Service."

## 2018-05-25 NOTE — ED Provider Notes (Signed)
Pt re-evaluated by psych team today: Pt denies SI/HI, no AVH, no psychosis; pt does not meet inpt criteria at this time and can be discharged with outpt resources. Will d/c stable.    Samuel Jester, DO 05/25/18 1324

## 2018-05-25 NOTE — ED Notes (Signed)
Pt given breakfast tray

## 2018-07-22 ENCOUNTER — Emergency Department (HOSPITAL_COMMUNITY)
Admission: EM | Admit: 2018-07-22 | Discharge: 2018-07-23 | Disposition: A | Discharge status: Home / Self Care | Payer: Medicaid Other | Attending: Emergency Medicine | Admitting: Emergency Medicine

## 2018-07-22 ENCOUNTER — Encounter (HOSPITAL_COMMUNITY): Payer: Self-pay | Admitting: Emergency Medicine

## 2018-07-22 ENCOUNTER — Other Ambulatory Visit: Payer: Self-pay

## 2018-07-22 ENCOUNTER — Emergency Department (HOSPITAL_COMMUNITY): Payer: Medicaid Other

## 2018-07-22 DIAGNOSIS — F17210 Nicotine dependence, cigarettes, uncomplicated: Secondary | ICD-10-CM | POA: Insufficient documentation

## 2018-07-22 DIAGNOSIS — F909 Attention-deficit hyperactivity disorder, unspecified type: Secondary | ICD-10-CM | POA: Diagnosis not present

## 2018-07-22 DIAGNOSIS — R410 Disorientation, unspecified: Secondary | ICD-10-CM | POA: Diagnosis present

## 2018-07-22 DIAGNOSIS — F15280 Other stimulant dependence with stimulant-induced anxiety disorder: Secondary | ICD-10-CM | POA: Insufficient documentation

## 2018-07-22 DIAGNOSIS — F1910 Other psychoactive substance abuse, uncomplicated: Secondary | ICD-10-CM | POA: Diagnosis not present

## 2018-07-22 DIAGNOSIS — F29 Unspecified psychosis not due to a substance or known physiological condition: Secondary | ICD-10-CM | POA: Insufficient documentation

## 2018-07-22 DIAGNOSIS — F1410 Cocaine abuse, uncomplicated: Secondary | ICD-10-CM | POA: Diagnosis not present

## 2018-07-22 LAB — COMPREHENSIVE METABOLIC PANEL
ALBUMIN: 4.7 g/dL (ref 3.5–5.0)
ALT: 108 U/L — ABNORMAL HIGH (ref 0–44)
AST: 64 U/L — ABNORMAL HIGH (ref 15–41)
Alkaline Phosphatase: 60 U/L (ref 38–126)
Anion gap: 10 (ref 5–15)
BUN: 13 mg/dL (ref 6–20)
CHLORIDE: 105 mmol/L (ref 98–111)
CO2: 22 mmol/L (ref 22–32)
Calcium: 8.9 mg/dL (ref 8.9–10.3)
Creatinine, Ser: 0.74 mg/dL (ref 0.61–1.24)
GFR calc Af Amer: >60 mL/min (ref >60)
GFR calc non Af Amer: >60 mL/min (ref >60)
Glucose, Bld: 111 mg/dL — ABNORMAL HIGH (ref 70–99)
Potassium: 3.5 mmol/L (ref 3.5–5.1)
Sodium: 137 mmol/L (ref 135–145)
Total Bilirubin: 0.7 mg/dL (ref 0.3–1.2)
Total Protein: 7.3 g/dL (ref 6.5–8.1)

## 2018-07-22 LAB — CBC WITH DIFFERENTIAL/PLATELET
Abs Immature Granulocytes: 0.03 10*3/uL (ref 0.00–0.07)
Basophils Absolute: 0 10*3/uL (ref 0.0–0.1)
Basophils Relative: 1 %
Eosinophils Absolute: 0.2 10*3/uL (ref 0.0–0.5)
Eosinophils Relative: 2 %
HCT: 39.6 % (ref 39.0–52.0)
Hemoglobin: 13.7 g/dL (ref 13.0–17.0)
IMMATURE GRANULOCYTES: 0 %
LYMPHS ABS: 1.4 10*3/uL (ref 0.7–4.0)
Lymphocytes Relative: 18 %
MCH: 30.8 pg (ref 26.0–34.0)
MCHC: 34.6 g/dL (ref 30.0–36.0)
MCV: 89 fL (ref 80.0–100.0)
Monocytes Absolute: 0.7 10*3/uL (ref 0.1–1.0)
Monocytes Relative: 9 %
Neutro Abs: 5.5 10*3/uL (ref 1.7–7.7)
Neutrophils Relative %: 70 %
Platelets: 257 10*3/uL (ref 150–400)
RBC: 4.45 MIL/uL (ref 4.22–5.81)
RDW: 12.3 % (ref 11.5–15.5)
WBC: 7.9 10*3/uL (ref 4.0–10.5)
nRBC: 0 % (ref 0.0–0.2)

## 2018-07-22 LAB — RAPID URINE DRUG SCREEN, HOSP PERFORMED
Amphetamines: POSITIVE — AB
BENZODIAZEPINES: POSITIVE — AB
Barbiturates: NOT DETECTED
Cocaine: POSITIVE — AB
OPIATES: NOT DETECTED
Tetrahydrocannabinol: POSITIVE — AB

## 2018-07-22 LAB — ETHANOL: Alcohol, Ethyl (B): <10 mg/dL (ref <10)

## 2018-07-22 MED ORDER — ZIPRASIDONE MESYLATE 20 MG IM SOLR
10.0000 mg | Freq: Once | INTRAMUSCULAR | Status: AC
  Administered 2018-07-22: 10 mg via INTRAMUSCULAR
  Filled 2018-07-22: qty 20

## 2018-07-22 MED ORDER — STERILE WATER FOR INJECTION IJ SOLN
INTRAMUSCULAR | Status: AC
  Filled 2018-07-22: qty 10

## 2018-07-22 NOTE — ED Triage Notes (Signed)
Pt brought in by [facility] for drug problem. Pt reports using methamphetamine today. Pt is responding to external stimuli.

## 2018-07-22 NOTE — BH Assessment (Signed)
Received TTS consult request. Per ED staff, Pt has been given medication and is unable to participate in assessment. [facility] staff will call TTS at [phone] when Pt is ready for assessment.   Pamalee Leyden, [facility], [facility], [facility] Triage Specialist [phone]

## 2018-07-22 NOTE — ED Notes (Signed)
Signed-off for [facility] Department Deputy Lyna Poser to leave patient with sitter. Patient currently sleeping without any distress noted.

## 2018-07-22 NOTE — ED Notes (Signed)
Pt to Ct accompanied by sitter and Deputy Rubye Oaks.
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
## 2018-07-23 ENCOUNTER — Other Ambulatory Visit: Payer: Self-pay

## 2018-07-23 ENCOUNTER — Emergency Department (HOSPITAL_COMMUNITY)
Admission: EM | Admit: 2018-07-23 | Discharge: 2018-07-23 | Discharge status: Against Medical Advice | Payer: Medicaid Other | Source: Home / Self Care

## 2018-07-23 MED ORDER — SODIUM CHLORIDE 0.9 % IV BOLUS (SEPSIS)
1000.0000 mL | Freq: Once | INTRAVENOUS | Status: AC
  Administered 2018-07-23: 1000 mL via INTRAVENOUS

## 2018-07-23 NOTE — ED Notes (Signed)
Pt states, "this ain't nothing rehab is going to fix. If something is wrong with me, I want medical help. I don't want to die here. If I'm going to die, I want to die outside of here. Not inside the hospital or jail". When asked specifically about SI, pt denies SI. Pt pacing around the room. Pt very upset about having to stay and wait for psychiatry to re-evaluate pt. Pt states, "I'm going to leave by 10. I don't care if they have talked to me or not by then. I'm leaving".

## 2018-07-23 NOTE — ED Notes (Signed)
TTS machine at bedside. 
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
## 2018-07-23 NOTE — ED Provider Notes (Signed)
  Physical Exam  BP (!) 92/55   Pulse 77   Temp 98.2 F (36.8 C) (Oral)   Resp 18   Wt 86.2 kg   SpO2 99%   BMI 28.06 kg/m   Physical Exam  ED Course/Procedures     Procedures  MDM  Patient with psychosis.  Think likely secondary to drug use.  Geodon required to calm down.  Head CT reassuring.  When more awake will need TTS.       Benjiman Core, MD 07/23/18 0001

## 2018-07-23 NOTE — ED Notes (Signed)
IVC paperwork rescinded at this time and faxed to the [facility].

## 2018-07-23 NOTE — ED Provider Notes (Addendum)
Nurse came to me stating patient has low blood pressure.  When I review his chart his pressure has been low for at least 2 hours.  It also happened after he was given Geodon.  He was given a normal saline bolus.  01:15 AM patient has gotten his liter of normal saline.  His blood pressure is 100/64 with heart rate of 57.  Patient is sleeping.  4:30 AM nurse reports patient now is awake and alert and able to hold a conversation.  TTS consult was ordered.  4:47 AM [place], TTS has evaluated patient.  He states their nurse practitioner recommends psychiatry evaluate in the morning.  He has been off his psychiatric medications since he has been in jail.   Devoria Albe, MD 07/23/18 Loralie Champagne, MD 07/23/18 [phone]

## 2018-07-23 NOTE — ED Notes (Signed)
Pt alert, oriented, and ambulatory at this time.

## 2018-07-23 NOTE — ED Notes (Signed)
Pt upset saying "I don't want to be followed around like a damn kid." Pt instructed that the sitter had to stay with him. Pt agitated but willing to stay in his room at this time. Pt updated on plan of care that we are awaiting re-eval by psychiatry this morning.

## 2018-07-23 NOTE — Discharge Instructions (Addendum)
Follow-up with community mental health resources.  Recommend discontinuing all street drugs.
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
## 2018-07-23 NOTE — ED Provider Notes (Signed)
Behavioral health consult obtained.  No suicidal or homicidal ideation.  Patient is not presently psychotic.  I discussed his sisters concern about his chest pain.  He refused any further medical evaluation in regards to this issue.   Donnetta Hutching, MD 07/23/18 1013

## 2018-07-23 NOTE — ED Notes (Signed)
Pt attempted to walk out of the facility. Sitter followed pt down the hallway to exit. Security and [employer] met pt at the ED entrance. Pt's cousin and girlfriend were at the ED entrance doors and when they saw pt trying to leave they assisted [employer] in helping to get pt to come back to ED room. Pt did come back to the ED room and [employer] officer is at bedside trying to calm pt. Charge RN called [facility] and told them this pt's re-eval needed to be prioritized. Pt visibly upset, yelling, throwing his hands up.

## 2018-08-07 ENCOUNTER — Other Ambulatory Visit: Payer: Self-pay

## 2018-08-07 ENCOUNTER — Emergency Department (HOSPITAL_COMMUNITY)
Admission: EM | Admit: 2018-08-07 | Discharge: 2018-08-07 | Discharge status: Against Medical Advice | Payer: Medicaid Other | Attending: Emergency Medicine | Admitting: Emergency Medicine

## 2018-08-07 ENCOUNTER — Encounter (HOSPITAL_COMMUNITY): Payer: Self-pay | Admitting: Emergency Medicine

## 2018-08-07 DIAGNOSIS — R443 Hallucinations, unspecified: Secondary | ICD-10-CM | POA: Diagnosis present

## 2018-08-07 DIAGNOSIS — F1510 Other stimulant abuse, uncomplicated: Secondary | ICD-10-CM | POA: Diagnosis not present

## 2018-08-07 DIAGNOSIS — F17210 Nicotine dependence, cigarettes, uncomplicated: Secondary | ICD-10-CM | POA: Insufficient documentation

## 2018-08-07 DIAGNOSIS — F15180 Other stimulant abuse with stimulant-induced anxiety disorder: Secondary | ICD-10-CM | POA: Diagnosis not present

## 2018-08-07 DIAGNOSIS — Z5320 Procedure and treatment not carried out because of patient's decision for unspecified reasons: Secondary | ICD-10-CM | POA: Diagnosis not present

## 2018-08-07 LAB — COMPREHENSIVE METABOLIC PANEL
ALT: 22 U/L (ref 0–44)
AST: 25 U/L (ref 15–41)
Albumin: 5.5 g/dL — ABNORMAL HIGH (ref 3.5–5.0)
Alkaline Phosphatase: 61 U/L (ref 38–126)
Anion gap: 12 (ref 5–15)
BUN: 12 mg/dL (ref 6–20)
CO2: 21 mmol/L — ABNORMAL LOW (ref 22–32)
Calcium: 9.8 mg/dL (ref 8.9–10.3)
Chloride: 103 mmol/L (ref 98–111)
Creatinine, Ser: 0.89 mg/dL (ref 0.61–1.24)
GFR calc Af Amer: >60 mL/min (ref >60)
GFR calc non Af Amer: >60 mL/min (ref >60)
Glucose, Bld: 111 mg/dL — ABNORMAL HIGH (ref 70–99)
Potassium: 3.1 mmol/L — ABNORMAL LOW (ref 3.5–5.1)
Sodium: 136 mmol/L (ref 135–145)
Total Bilirubin: 1.1 mg/dL (ref 0.3–1.2)
Total Protein: 8.6 g/dL — ABNORMAL HIGH (ref 6.5–8.1)

## 2018-08-07 LAB — CBC
HCT: 47.3 % (ref 39.0–52.0)
Hemoglobin: 17 g/dL (ref 13.0–17.0)
MCH: 31.5 pg (ref 26.0–34.0)
MCHC: 35.9 g/dL (ref 30.0–36.0)
MCV: 87.6 fL (ref 80.0–100.0)
Platelets: 365 10*3/uL (ref 150–400)
RBC: 5.4 MIL/uL (ref 4.22–5.81)
RDW: 12.5 % (ref 11.5–15.5)
WBC: 11.4 10*3/uL — ABNORMAL HIGH (ref 4.0–10.5)
nRBC: 0 % (ref 0.0–0.2)

## 2018-08-07 LAB — RAPID URINE DRUG SCREEN, HOSP PERFORMED
AMPHETAMINES: POSITIVE — AB
BENZODIAZEPINES: NOT DETECTED
Barbiturates: NOT DETECTED
Cocaine: NOT DETECTED
Opiates: NOT DETECTED
Tetrahydrocannabinol: NOT DETECTED

## 2018-08-07 LAB — ETHANOL: Alcohol, Ethyl (B): <10 mg/dL (ref <10)

## 2018-08-07 MED ORDER — POTASSIUM CHLORIDE CRYS ER 20 MEQ PO TBCR: 40.0000 meq | EXTENDED_RELEASE_TABLET | Freq: Once | ORAL | Status: DC

## 2018-08-07 NOTE — ED Notes (Signed)
Pt states that he did not want to talk to them people and if we were not going to do a MRI on him he was leaving the patient was no SI or HI at this time. Pt walked out of the ER

## 2018-08-07 NOTE — ED Triage Notes (Signed)
Patient brought in by [facility] for hallucinations.
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
## 2019-03-18 ENCOUNTER — Encounter (HOSPITAL_COMMUNITY): Payer: Self-pay | Admitting: Emergency Medicine

## 2019-03-18 ENCOUNTER — Other Ambulatory Visit: Payer: Self-pay

## 2019-03-18 ENCOUNTER — Emergency Department (HOSPITAL_COMMUNITY)
Admission: EM | Admit: 2019-03-18 | Discharge: 2019-03-18 | Disposition: A | Discharge status: Home / Self Care | Payer: Medicaid Other | Attending: Emergency Medicine | Admitting: Emergency Medicine

## 2019-03-18 DIAGNOSIS — F22 Delusional disorders: Secondary | ICD-10-CM | POA: Diagnosis present

## 2019-03-18 DIAGNOSIS — Z5321 Procedure and treatment not carried out due to patient leaving prior to being seen by health care provider: Secondary | ICD-10-CM | POA: Insufficient documentation

## 2019-03-18 NOTE — ED Triage Notes (Signed)
Pt states he "took some bad drugs tonight." Pt reports taking cocaine and meth tonight.

## 2019-03-18 NOTE — ED Notes (Signed)
Call X1.No answer.

## 2019-03-18 NOTE — ED Notes (Signed)
Pt notified registration he was leaving.

## 2019-05-01 ENCOUNTER — Encounter: Payer: Self-pay | Admitting: Emergency Medicine

## 2019-05-01 ENCOUNTER — Other Ambulatory Visit: Payer: Self-pay

## 2019-05-01 DIAGNOSIS — F1510 Other stimulant abuse, uncomplicated: Secondary | ICD-10-CM | POA: Diagnosis not present

## 2019-05-01 DIAGNOSIS — R4689 Other symptoms and signs involving appearance and behavior: Secondary | ICD-10-CM | POA: Diagnosis present

## 2019-05-01 DIAGNOSIS — F319 Bipolar disorder, unspecified: Secondary | ICD-10-CM | POA: Diagnosis not present

## 2019-05-01 DIAGNOSIS — F17210 Nicotine dependence, cigarettes, uncomplicated: Secondary | ICD-10-CM | POA: Insufficient documentation
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
## 2019-05-02 ENCOUNTER — Emergency Department
Admission: EM | Admit: 2019-05-02 | Discharge: 2019-05-02 | Disposition: A | Discharge status: Home / Self Care | Payer: Medicaid Other | Attending: Emergency Medicine | Admitting: Emergency Medicine

## 2019-05-02 DIAGNOSIS — F1910 Other psychoactive substance abuse, uncomplicated: Secondary | ICD-10-CM | POA: Diagnosis present

## 2019-05-02 DIAGNOSIS — R4689 Other symptoms and signs involving appearance and behavior: Secondary | ICD-10-CM

## 2019-05-02 HISTORY — DX: Other psychoactive substance abuse, uncomplicated: F19.10

## 2019-05-02 LAB — COMPREHENSIVE METABOLIC PANEL
ALT: 46 U/L — ABNORMAL HIGH (ref 0–44)
AST: 26 U/L (ref 15–41)
Albumin: 4.8 g/dL (ref 3.5–5.0)
Alkaline Phosphatase: 52 U/L (ref 38–126)
Anion gap: 11 (ref 5–15)
BUN: 12 mg/dL (ref 6–20)
CO2: 24 mmol/L (ref 22–32)
Calcium: 9.3 mg/dL (ref 8.9–10.3)
Chloride: 104 mmol/L (ref 98–111)
Creatinine, Ser: 0.91 mg/dL (ref 0.61–1.24)
GFR calc Af Amer: >60 mL/min (ref >60)
GFR calc non Af Amer: >60 mL/min (ref >60)
Glucose, Bld: 99 mg/dL (ref 70–99)
Potassium: 3.8 mmol/L (ref 3.5–5.1)
Sodium: 139 mmol/L (ref 135–145)
Total Bilirubin: 0.8 mg/dL (ref 0.3–1.2)
Total Protein: 7.7 g/dL (ref 6.5–8.1)

## 2019-05-02 LAB — CBC
HCT: 43.6 % (ref 39.0–52.0)
Hemoglobin: 15.3 g/dL (ref 13.0–17.0)
MCH: 31.2 pg (ref 26.0–34.0)
MCHC: 35.1 g/dL (ref 30.0–36.0)
MCV: 89 fL (ref 80.0–100.0)
Platelets: 296 10*3/uL (ref 150–400)
RBC: 4.9 MIL/uL (ref 4.22–5.81)
RDW: 12 % (ref 11.5–15.5)
WBC: 8.1 10*3/uL (ref 4.0–10.5)
nRBC: 0 % (ref 0.0–0.2)

## 2019-05-02 LAB — ACETAMINOPHEN LEVEL: Acetaminophen (Tylenol), Serum: <10 ug/mL — ABNORMAL LOW (ref 10–30)

## 2019-05-02 LAB — ETHANOL: Alcohol, Ethyl (B): <10 mg/dL (ref <10)

## 2019-05-02 LAB — SALICYLATE LEVEL: Salicylate Lvl: <7 mg/dL — ABNORMAL LOW (ref 7.0–30.0)

## 2019-05-02 NOTE — ED Notes (Signed)
Unable to contact family member. Patient has been discharged to home.

## 2019-05-02 NOTE — ED Provider Notes (Signed)
Cleared for discharge by psych   Lavonia Drafts, MD 05/02/19 1105

## 2019-05-02 NOTE — ED Notes (Signed)
Pt to room 23.  Pt given meal tray and sprite.  Pt ate all of meal and given toothbrush and toothpaste at his request.  Pt returned both when finished.  Lights out and pt resting quietly.

## 2019-05-02 NOTE — Consult Note (Signed)
[facility] Face-to-Face Psychiatry Consult   Reason for Consult: Behavior problems Referring Physician: Dr. Marisa SeverinSiadecki Patient Identification: Scott Huber MRN:  [id number] Principal Diagnosis: <principal problem not specified> Diagnosis:  Active Problems:   Polysubstance abuse (HCC)   Total Time spent with patient: 30 minutes  Subjective: "I cannot believe my mom took papers on me." Scott Huber is a 28 y.o. male patient [facility] ED for law enforcement under involuntary commitment status (IVC).  The patient was seen face-to-face by this provider; chart reviewed and consulted with Dr.Siadecki on 05/02/2019 due to the patient's care. It was discussed with the EDP that the patient would be observed overnight and reassess in the a.m. to determine if the patient meets criteria for psychiatric inpatient admission or could be discharged back to the community.  He is alert and oriented x4, anxious but cooperative, and mood-congruent with affect.  The patient denies any suicidal ideation or homicidal ideation. According to the IVC paperwork, the patient's mother reports the patient has a history of mental illness, polysubstance abuse and documents the patient uses methamphetamines. He has become increasingly aggressive and threatening towards his mother.  The patient's mother discussed it; he became delusional, hallucinating, and claimed someone shot him in the head. When asked why the patient was brought here during the assessment, the patient reports, "my mom she on some shit." The patient says he got into a verbal confrontation with his mother. The patient reports he currently lives with a friend and his mother is also friends with the patient's friend.  He discussed his mother came to his friend's home, which is when the verbal confrontation took place. The patient reports he has a current assault charge due to the incident that took place today (12.23.20).  The patient admits to substance-using  substances two days ago and declares that he only uses multiple substances.  The patient does realize that he has been hospitalized in the past for depression and anger, and the patient reports that [facility] was [facility] to [facility].  He states he does not see a psychiatrist and is currently not taking any medication. Collateral was obtained. This provider left a HIPPA appropriate message for Ms. Chelsea AusChristy Charter [phone]). HPI: Per Dr. Marisa SeverinSiadecki; Scott Huber is a 28 y.o. male with PMH, as noted below, who presents under involuntary commitment after his mother felt threatened by him.  Per the IVC paperwork, the patient has a history of heavy methamphetamine abuse and behaves erratically and threatening his mother.  The patient states that he has ongoing conflicts with his mother, who he feels is crazy and trying to instigate trauma.  He does endorse methamphetamine use but states his last use was several days ago.  He has no SI or HI.  He denies any acute medical complaints. Plan: hat the patient will be observed overnight and reassess in the a.m. to determine if the patient meets criteria for psychiatric inpatient admission or could be discharged back to the community.   Past Psychiatric History:  ADHD Anxiety Bipolar 1 disorder (HCC) Panic attack Stimulant abuse (HCC)  Risk to Self: Suicidal Ideation: No Suicidal Intent: No Is patient at risk for suicide?: No Suicidal Plan?: No Access to Means: No What has been your use of drugs/alcohol within the last 12 months?: Reports smoking marijuana Triggers for Past Attempts: None known Intentional Self Injurious Behavior: None Risk to Others: Homicidal Ideation: No Thoughts of Harm to Others: No Current Homicidal Intent: No Current Homicidal Plan: No  Access to Homicidal Means: No History of harm to others?: No Assessment of Violence: None Noted Does patient have access to weapons?: No Criminal Charges Pending?: Yes Describe Pending Criminal  Charges: Current Assualt Charge Does patient have a court date: No Prior Inpatient Therapy: Prior Inpatient Therapy: Yes Prior Therapy Dates: Unknown Prior Therapy Facilty/Provider([facility]): [facility] Reason for Treatment: Depression, Anger Prior Outpatient Therapy: Prior Outpatient Therapy: No Does patient have an ACCT team?: No Does patient have Intensive In-House Services?  : No Does patient have [facility] services? : No Does patient have P4CC services?: No  Past Medical History:  Past Medical History:  Diagnosis Date  . ADHD   . Anxiety   . Bipolar 1 disorder (HCC)   . Genital herpes   . Panic attack   . Stimulant abuse (HCC)    History reviewed. No pertinent surgical history. Family History: No family history on file. Family Psychiatric  History:  Social History:  Social History   Substance and Sexual Activity  Alcohol Use Yes   Comment: occ     Social History   Substance and Sexual Activity  Drug Use Yes  . Types: Cocaine, Methamphetamines   Comment: heroin    Social History   Socioeconomic History  . Marital status: Single    Spouse name: Not on file  . Number of children: Not on file  . Years of education: Not on file  . Highest education level: Not on file  Occupational History  . Not on file  Tobacco Use  . Smoking status: Current Every Day Smoker    Packs/day: 1.00    Types: Cigarettes  . Smokeless tobacco: Never Used  Substance and Sexual Activity  . Alcohol use: Yes    Comment: occ  . Drug use: Yes    Types: Cocaine, Methamphetamines    Comment: heroin  . Sexual activity: Yes    Birth control/protection: Post-menopausal  Other Topics Concern  . Not on file  Social History Narrative  . Not on file   Social Determinants of Health   Financial Resource Strain:   . Difficulty of Paying Living Expenses: Not on file  Food Insecurity:   . Worried About Programme researcher, broadcasting/film/video in the Last Year: Not on file  . Ran Out of Food in the Last Year: Not on  file  Transportation Needs:   . Lack of Transportation (Medical): Not on file  . Lack of Transportation (Non-Medical): Not on file  Physical Activity:   . Days of Exercise per Week: Not on file  . Minutes of Exercise per Session: Not on file  Stress:   . Feeling of Stress : Not on file  Social Connections:   . Frequency of Communication with Friends and Family: Not on file  . Frequency of Social Gatherings with Friends and Family: Not on file  . Attends Religious Services: Not on file  . Active Member of Clubs or Organizations: Not on file  . Attends Banker Meetings: Not on file  . Marital Status: Not on file   Additional Social History:    Allergies:   Allergies  Allergen Reactions  . Hydrocodone Other (See Comments)    abd pain  . Vistaril [Hydroxyzine Hcl] Nausea Only    Labs:  Results for orders placed or performed during the hospital encounter of 05/02/19 (from the past 48 hour(s))  Comprehensive metabolic panel     Status: Abnormal   Collection Time: 05/02/19 12:01 AM  Result Value Ref  Range   Sodium 139 135 - 145 mmol/L   Potassium 3.8 3.5 - 5.1 mmol/L   Chloride 104 98 - 111 mmol/L   CO2 24 22 - 32 mmol/L   Glucose, Bld 99 70 - 99 mg/dL   BUN 12 6 - 20 mg/dL   Creatinine, Ser 0.91 0.61 - 1.24 mg/dL   Calcium 9.3 8.9 - 10.3 mg/dL   Total Protein 7.7 6.5 - 8.1 g/dL   Albumin 4.8 3.5 - 5.0 g/dL   AST 26 15 - 41 U/L   ALT 46 (H) 0 - 44 U/L   Alkaline Phosphatase 52 38 - 126 U/L   Total Bilirubin 0.8 0.3 - 1.2 mg/dL   GFR calc non Af Amer >60 >60 mL/min   GFR calc Af Amer >60 >60 mL/min   Anion gap 11 5 - 15    Comment: Performed at [facility], [address] [place], [place] [address]  Ethanol     Status: None   Collection Time: 05/02/19 12:01 AM  Result Value Ref Range   Alcohol, Ethyl (B) <10 <10 mg/dL    Comment: (NOTE) Lowest detectable limit for serum alcohol is 10 mg/dL. For medical purposes only. Performed at [facility], [address] [place], [place] [address]   Salicylate level     Status: Abnormal   Collection Time: 05/02/19 12:01 AM  Result Value Ref Range   Salicylate Lvl <8.2 (L) 7.0 - 30.0 mg/dL    Comment: Performed at [facility], [place], [place] [address]  Acetaminophen level     Status: Abnormal   Collection Time: 05/02/19 12:01 AM  Result Value Ref Range   Acetaminophen (Tylenol), Serum <10 (L) 10 - 30 ug/mL    Comment: (NOTE) Therapeutic concentrations vary significantly. A range of 10-30 ug/mL  may be an effective concentration for many patients. However, some  are best treated at concentrations outside of this range. Acetaminophen concentrations >150 ug/mL at 4 hours after ingestion  and >50 ug/mL at 12 hours after ingestion are often associated with  toxic reactions. Performed at [facility], [address] [place], [place] [address]   cbc     Status: None   Collection Time: 05/02/19 12:01 AM  Result Value Ref Range   WBC 8.1 4.0 - 10.5 K/uL   RBC 4.90 4.22 - 5.81 MIL/uL   Hemoglobin 15.3 13.0 - 17.0 g/dL   HCT 43.6 39.0 - 52.0 %   MCV 89.0 80.0 - 100.0 fL   MCH 31.2 26.0 - 34.0 pg   MCHC 35.1 30.0 - 36.0 g/dL   RDW 12.0 11.5 - 15.5 %   Platelets 296 150 - 400 K/uL   nRBC 0.0 0.0 - 0.2 %    Comment: Performed at [facility], [address] [place], [place] [address]    No current facility-administered medications for this encounter.   Current Outpatient Medications  Medication Sig Dispense Refill  . acetaminophen (TYLENOL) 500 MG tablet Take 1,000 mg by mouth every 6 (six) hours as needed for mild pain or headache.      Musculoskeletal: Strength & Muscle Tone: within normal limits Gait & Station: normal Patient leans: N/A  Psychiatric Specialty Exam: Physical Exam  Nursing note and vitals reviewed. Constitutional: He is oriented to person, place, and time. He appears well-developed and  well-nourished.  Eyes: Pupils are equal, round, and reactive to light. Conjunctivae are normal.  Musculoskeletal:     Cervical  back: Normal range of motion and neck supple.  Neurological: He is alert and oriented to person, place, and time.  Skin: Skin is warm and dry.    Review of Systems  Psychiatric/Behavioral: Positive for agitation. The patient is nervous/anxious.   All other systems reviewed and are negative.   Blood pressure (!) 140/93, pulse (!) 104, temperature 98 F (36.7 C), temperature source Oral, resp. rate 18, height 5\' 10"  (1.778 m), weight 92.1 kg, SpO2 99 %.Body mass index is 29.13 kg/m.  General Appearance: Casual  Eye Contact:  Good  Speech:  Clear and Coherent  Volume:  Normal  Mood:  Angry, Anxious and Depressed  Affect:  Congruent  Thought Process:  Coherent  Orientation:  Full (Time, Place, and Person)  Thought Content:  WDL and Logical  Suicidal Thoughts:  No  Homicidal Thoughts:  No  Memory:  Immediate;   Good Recent;   Good Remote;   Good  Judgement:  Good  Insight:  Lacking  Psychomotor Activity:  Normal  Concentration:  Concentration: Good and Attention Span: Good  Recall:  Good  Fund of Knowledge:  Good  Language:  Good  Akathisia:  Negative  Handed:  Right  AIMS (if indicated):     Assets:  Communication Skills Desire for Improvement Social Support  ADL's:  Intact  Cognition:  WNL  Sleep:    Good     Treatment Plan Summary: Daily contact with patient to assess and evaluate symptoms and progress in treatment and Plan Will be observed overnight and reassess in the a.m. to determine if he meets criteria for psychiatric inpatient admission or could be discharged back home.  Disposition: Supportive therapy provided about ongoing stressors. The patient will remain under observation overnight and reassess in the a.m. to determine if he meets criteria for psychiatric inpatient admission or could be discharged back home.  ,  NP 05/02/2019 5:21 AM

## 2019-05-02 NOTE — ED Provider Notes (Signed)
[facility] Emergency Department Provider Note ____________________________________________   First MD Initiated Contact with Patient 05/02/19 0020     (approximate)  I have reviewed the triage vital signs and the nursing notes.   HISTORY  Chief Complaint Psychiatric Evaluation    HPI Scott Huber is a 28 y.o. male with PMH as noted below who presents under involuntary commitment after his mother apparently felt threatened by him.  Per the IVC paperwork, the patient has a history of heavy methamphetamine abuse and has been behaving erratically and threatening his mother.  The patient states that he has ongoing conflicts with his mother who he feels is crazy and trying to instigate trauma.  He does endorse methamphetamine use but states his last use was several days ago.  He has no SI or HI.  He denies any acute medical complaints.  Past Medical History:  Diagnosis Date  . ADHD   . Anxiety   . Bipolar 1 disorder ([place])   . Genital herpes   . Panic attack   . Stimulant abuse [facility])     Patient Active Problem List   Diagnosis Date Noted  . Polysubstance abuse ([place]) 05/02/2019    History reviewed. No pertinent surgical history.  Prior to Admission medications   Medication Sig Start Date End Date Taking? Authorizing Provider  acetaminophen (TYLENOL) 500 MG tablet Take 1,000 mg by mouth every 6 (six) hours as needed for mild pain or headache.    [provider]    Allergies Hydrocodone and Vistaril [hydroxyzine hcl]  No family history on file.  Social History Social History   Tobacco Use  . Smoking status: Current Every Day Smoker    Packs/day: 1.00    Types: Cigarettes  . Smokeless tobacco: Never Used  Substance Use Topics  . Alcohol use: Yes    Comment: occ  . Drug use: Yes    Types: Cocaine, Methamphetamines    Comment: heroin    Review of Systems  Constitutional: No fever/chills Eyes: No visual changes. ENT: No sore  throat. Cardiovascular: Denies chest pain. Respiratory: Denies shortness of breath. Gastrointestinal: No vomiting or diarrhea.  Genitourinary: Negative for flank pain. Musculoskeletal: Negative for back pain. Skin: Negative for rash. Neurological: Negative for headache.   ____________________________________________   PHYSICAL EXAM:  VITAL SIGNS: ED Triage Vitals  Enc Vitals Group     BP 05/01/19 2353 (!) 140/93     Pulse Rate 05/01/19 2353 (!) 104     Resp 05/01/19 2353 18     Temp 05/01/19 2353 98 F (36.7 C)     Temp Source 05/01/19 2353 Oral     SpO2 05/01/19 2353 99 %     Weight 05/01/19 2351 203 lb (92.1 kg)     Height 05/01/19 2351 5\' 10"  (1.778 m)     Head Circumference --      Peak Flow --      Pain Score 05/01/19 2351 0     Pain Loc --      Pain Edu? --      Excl. in [place]? --     Constitutional: Alert and oriented. Well appearing and in no acute distress. Eyes: Conjunctivae are normal.  Head: Atraumatic. Nose: No congestion/rhinnorhea. Mouth/Throat: Mucous membranes are moist.   Neck: Normal range of motion.  Cardiovascular: Good peripheral circulation. Respiratory: Normal respiratory effort.  No retractions.  Gastrointestinal: No distention.  Musculoskeletal:  Extremities warm and well perfused.  Neurologic:  Normal speech and language.  No gross focal neurologic deficits are appreciated.  Skin:  Skin is warm and dry. No rash noted. Psychiatric: Calm and cooperative.  ____________________________________________   LABS (all labs ordered are listed, but only abnormal results are displayed)  Labs Reviewed  COMPREHENSIVE METABOLIC PANEL - Abnormal; Notable for the following components:      Result Value   ALT 46 (*)    All other components within normal limits  SALICYLATE LEVEL - Abnormal; Notable for the following components:   Salicylate Lvl <7.0 (*)    All other components within normal limits  ACETAMINOPHEN LEVEL - Abnormal; Notable for the  following components:   Acetaminophen (Tylenol), Serum <10 (*)    All other components within normal limits  SARS CORONAVIRUS 2 (TAT 6-24 HRS)  ETHANOL  CBC  URINE DRUG SCREEN, QUALITATIVE ([facility] ONLY)   ____________________________________________  EKG   ____________________________________________  RADIOLOGY    ____________________________________________   PROCEDURES  Procedure(s) performed: No  Procedures  Critical Care performed: No ____________________________________________   INITIAL IMPRESSION / ASSESSMENT AND PLAN / ED COURSE  Pertinent labs & imaging results that were available during my care of the patient were reviewed by me and considered in my medical decision making (see chart for details).  28 year old male with PMH as noted above presents under involuntary commitment due to apparent threatening behavior towards his mother who is concerned for her safety.  Per the IVC paperwork, the patient also has a history of methamphetamine abuse.  The patient himself reports ongoing conflict with his mother but states that he does not want to hurt her.  He has no SI or HI.  He denies other acute psychiatric or medical complaints.    On exam, the patient is comfortable appearing.  His vital signs are normal except for borderline tachycardia when he arrived.  The remainder of the physical exam is unremarkable.  At this time, he does not appear acutely intoxicated.  I have ordered psychiatry and TTS consults.  Labs are pending for medical clearance.  Disposition will be based on psychiatry team recommendations.  ----------------------------------------- 6:16 AM on 05/02/2019 -----------------------------------------  Lab work-up is unremarkable.  The patient has been evaluated by the psychiatry NP.  We are awaiting reassessment by the psychiatry team to determine disposition.  I will sign the patient out to the oncoming physician at 7  AM.  ____________________________________________   FINAL CLINICAL IMPRESSION(S) / ED DIAGNOSES  Final diagnoses:  Behavior concern      NEW MEDICATIONS STARTED DURING THIS VISIT:  New Prescriptions   No medications on file     Note:  This document was prepared using Dragon voice recognition software and may include unintentional dictation errors.    Dionne Bucy, MD 05/02/19 [phone]

## 2019-05-02 NOTE — ED Notes (Signed)
Pt. To BHU from ED ambulatory without difficulty, to room  BHU 5. Report from [facility]. Pt. Is alert and oriented, warm and dry in no distress. Pt. Denies SI, HI, and AVH. Pt. Calm and cooperative. Pt. Made aware of security cameras and Q15 minute rounds. Pt. Encouraged to let Nursing staff know of any concerns or needs.

## 2019-05-02 NOTE — ED Notes (Signed)
Family here to pick patient up. Patient discharged from unit.

## 2019-05-02 NOTE — ED Notes (Signed)
Patient awakened and told he was discharged, phone provided for ride home.

## 2019-05-02 NOTE — ED Notes (Signed)
Pt reports he is here because of his mother, pt seen sitting on the side of the bed eating meal tray.  Pt asking about when he is going home and blames being here on his mother, pt is requesting to be discharged. Pt admits to drug use, but none tonight.    Per IVC paperwork, mother c/o patient has hx of mental illness, uses meth, has been increasingly aggressive, threatening mother, delusional, hallucinating and was claming someone shot him in the head.    Pt denies any

## 2019-05-02 NOTE — ED Notes (Addendum)
Pt dressed out into burgundy clothes along with [place] from the sheriff department. Pt belongings consists of the following: -[place] -wallet -bracelet  Pt belongings given to primary nurse.

## 2019-05-02 NOTE — ED Notes (Signed)
Belongings taken to BHU 
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
## 2020-12-15 ENCOUNTER — Emergency Department (HOSPITAL_COMMUNITY)
Admission: EM | Admit: 2020-12-15 | Discharge: 2020-12-15 | Disposition: A | Discharge status: Home / Self Care | Payer: Medicaid - Out of State | Attending: Emergency Medicine | Admitting: Emergency Medicine

## 2020-12-15 ENCOUNTER — Encounter (HOSPITAL_COMMUNITY): Payer: Self-pay | Admitting: Emergency Medicine

## 2020-12-15 ENCOUNTER — Other Ambulatory Visit: Payer: Self-pay

## 2020-12-15 DIAGNOSIS — T402X1A Poisoning by other opioids, accidental (unintentional), initial encounter: Secondary | ICD-10-CM | POA: Diagnosis not present

## 2020-12-15 DIAGNOSIS — R0689 Other abnormalities of breathing: Secondary | ICD-10-CM | POA: Insufficient documentation

## 2020-12-15 DIAGNOSIS — T50901A Poisoning by unspecified drugs, medicaments and biological substances, accidental (unintentional), initial encounter: Secondary | ICD-10-CM

## 2020-12-15 DIAGNOSIS — R198 Other specified symptoms and signs involving the digestive system and abdomen: Secondary | ICD-10-CM | POA: Diagnosis not present

## 2020-12-15 DIAGNOSIS — F17210 Nicotine dependence, cigarettes, uncomplicated: Secondary | ICD-10-CM | POA: Diagnosis not present

## 2020-12-15 DIAGNOSIS — X58XXXA Exposure to other specified factors, initial encounter: Secondary | ICD-10-CM | POA: Insufficient documentation

## 2020-12-15 LAB — COMPREHENSIVE METABOLIC PANEL
ALT: 50 U/L — ABNORMAL HIGH (ref 0–44)
AST: 48 U/L — ABNORMAL HIGH (ref 15–41)
Albumin: 4.9 g/dL (ref 3.5–5.0)
Alkaline Phosphatase: 49 U/L (ref 38–126)
Anion gap: 11 (ref 5–15)
BUN: 19 mg/dL (ref 6–20)
CO2: 24 mmol/L (ref 22–32)
Calcium: 8.5 mg/dL — ABNORMAL LOW (ref 8.9–10.3)
Chloride: 102 mmol/L (ref 98–111)
Creatinine, Ser: 1.25 mg/dL — ABNORMAL HIGH (ref 0.61–1.24)
GFR, Estimated: >60 mL/min (ref >60)
Glucose, Bld: 262 mg/dL — ABNORMAL HIGH (ref 70–99)
Potassium: 3.4 mmol/L — ABNORMAL LOW (ref 3.5–5.1)
Sodium: 137 mmol/L (ref 135–145)
Total Bilirubin: 2.1 mg/dL — ABNORMAL HIGH (ref 0.3–1.2)
Total Protein: 7.7 g/dL (ref 6.5–8.1)

## 2020-12-15 LAB — ACETAMINOPHEN LEVEL: Acetaminophen (Tylenol), Serum: <10 ug/mL — ABNORMAL LOW (ref 10–30)

## 2020-12-15 LAB — CBC WITH DIFFERENTIAL/PLATELET
Abs Immature Granulocytes: 0.02 10*3/uL (ref 0.00–0.07)
Basophils Absolute: 0.1 10*3/uL (ref 0.0–0.1)
Basophils Relative: 1 %
Eosinophils Absolute: 0.2 10*3/uL (ref 0.0–0.5)
Eosinophils Relative: 3 %
HCT: 42.9 % (ref 39.0–52.0)
Hemoglobin: 15.1 g/dL (ref 13.0–17.0)
Immature Granulocytes: 0 %
Lymphocytes Relative: 17 %
Lymphs Abs: 0.9 10*3/uL (ref 0.7–4.0)
MCH: 32.8 pg (ref 26.0–34.0)
MCHC: 35.2 g/dL (ref 30.0–36.0)
MCV: 93.1 fL (ref 80.0–100.0)
Monocytes Absolute: 0.6 10*3/uL (ref 0.1–1.0)
Monocytes Relative: 11 %
Neutro Abs: 3.7 10*3/uL (ref 1.7–7.7)
Neutrophils Relative %: 68 %
Platelets: 335 10*3/uL (ref 150–400)
RBC: 4.61 MIL/uL (ref 4.22–5.81)
RDW: 12.6 % (ref 11.5–15.5)
WBC: 5.4 10*3/uL (ref 4.0–10.5)
nRBC: 0 % (ref 0.0–0.2)

## 2020-12-15 LAB — RAPID URINE DRUG SCREEN, HOSP PERFORMED
Amphetamines: POSITIVE — AB
Barbiturates: NOT DETECTED
Benzodiazepines: NOT DETECTED
Cocaine: NOT DETECTED
Opiates: NOT DETECTED
Tetrahydrocannabinol: NOT DETECTED

## 2020-12-15 LAB — CBG MONITORING, ED: Glucose-Capillary: 241 mg/dL — ABNORMAL HIGH (ref 70–99)

## 2020-12-15 LAB — ETHANOL: Alcohol, Ethyl (B): <10 mg/dL (ref <10)

## 2020-12-15 LAB — SALICYLATE LEVEL: Salicylate Lvl: <7 mg/dL — ABNORMAL LOW (ref 7.0–30.0)

## 2020-12-15 MED ORDER — ONDANSETRON HCL 4 MG/2ML IJ SOLN: 4.0000 mg | Freq: Once | INTRAMUSCULAR | Status: AC

## 2020-12-15 MED ORDER — NALOXONE HCL 4 MG/10ML IJ SOLN
2.0000 mg/h | INTRAVENOUS | Status: DC
  Administered 2020-12-15: 2 mg/h via INTRAVENOUS
  Filled 2020-12-15: qty 4

## 2020-12-15 MED ORDER — SODIUM CHLORIDE 0.9 % IV SOLN: INTRAVENOUS | Status: DC

## 2020-12-15 MED ORDER — SODIUM CHLORIDE 0.9 % IV BOLUS
1000.0000 mL | Freq: Once | INTRAVENOUS | Status: AC
  Administered 2020-12-15: 1000 mL via INTRAVENOUS

## 2020-12-15 MED ORDER — NALOXONE HCL 2 MG/2ML IJ SOSY
PREFILLED_SYRINGE | INTRAMUSCULAR | Status: AC
  Filled 2020-12-15: qty 4

## 2020-12-15 MED ORDER — ONDANSETRON 4 MG PO TBDP
4.0000 mg | ORAL_TABLET | Freq: Once | ORAL | Status: AC
  Administered 2020-12-15: 4 mg via ORAL
  Filled 2020-12-15: qty 1

## 2020-12-15 MED ORDER — ONDANSETRON HCL 4 MG/2ML IJ SOLN
INTRAMUSCULAR | Status: AC
  Administered 2020-12-15: 4 mg via INTRAVENOUS
  Filled 2020-12-15: qty 2

## 2020-12-15 MED ORDER — CEPHALEXIN 500 MG PO CAPS: 500.0000 mg | ORAL_CAPSULE | Freq: Four times a day (QID) | ORAL | 0 refills | Status: DC

## 2020-12-15 MED ORDER — ONDANSETRON 8 MG PO TBDP: 8.0000 mg | ORAL_TABLET | Freq: Three times a day (TID) | ORAL | 0 refills | Status: DC | PRN

## 2020-12-15 NOTE — ED Notes (Signed)
A cup of ice water provided to patient per his request.

## 2020-12-15 NOTE — ED Triage Notes (Signed)
Pt arrived by [facility] for overdose to unknown drug. Pt given total of 10mg  of narcan. When arrival to ED pt able to follow some commands, arousable to light tactile stimuli. Pt denies taking anything.

## 2020-12-15 NOTE — Discharge Instructions (Addendum)
Avoid using drugs.  Irrigate the umbilical area with soap and water.  Also apply hydrogen peroxide.  Take the antibiotics as prescribed

## 2020-12-15 NOTE — ED Provider Notes (Signed)
[facility] EMERGENCY DEPARTMENT Provider Note   CSN: [id number] Arrival date & time: 12/15/20  1735     History Chief Complaint  Patient presents with   Drug Overdose    Scott Huber is a 30 y.o. male.   Drug Overdose   Patient presented to the ED for evaluation of presumed opiate overdose.  Patient arrived via EMS.  History was obtained from them.  Family told EMS that patient thought he was using cocaine but ended up snorting heroin.  They administered Narcan at home when the patient became unresponsive.  They ended up calling EMS.  EMS administered Narcan nasally as well as a milligram of Narcan IV.  They had to assist his ventilations with bag valve mask.  Patient remained minimally responsive but he started to improve on route.  In the ED patient is breathing spontaneously.  He will look at me and somewhat answer questions but is very difficult to understand.  He has not been able to tell me what he took.  He will mumble his name but otherwise is not providing any other history  Past Medical History:  Diagnosis Date   ADHD    Anxiety    Bipolar 1 disorder (HCC)    Genital herpes    Panic attack    Stimulant abuse [facility])     Patient Active Problem List   Diagnosis Date Noted   Polysubstance abuse (HCC) 05/02/2019    History reviewed. No pertinent surgical history.     History reviewed. No pertinent family history.  Social History   Tobacco Use   Smoking status: Every Day    Packs/day: 1.00    Types: Cigarettes   Smokeless tobacco: Never  Vaping Use   Vaping Use: Never used  Substance Use Topics   Alcohol use: Yes    Comment: occ   Drug use: Yes    Types: Cocaine, Methamphetamines    Comment: heroin    Home Medications Prior to Admission medications   Medication Sig Start Date End Date Taking? Authorizing Provider  acetaminophen (TYLENOL) 500 MG tablet Take 1,000 mg by mouth every 6 (six) hours as needed for mild pain or headache.   Yes [provider]  cephALEXin (KEFLEX) 500 MG capsule Take 1 capsule (500 mg total) by mouth 4 (four) times daily. 12/15/20  Yes Linwood Dibbles, MD  ondansetron (ZOFRAN ODT) 8 MG disintegrating tablet Take 1 tablet (8 mg total) by mouth every 8 (eight) hours as needed for nausea or vomiting. 12/15/20  Yes Linwood Dibbles, MD    Allergies    Hydrocodone and Vistaril [hydroxyzine hcl]  Review of Systems   Review of Systems  All other systems reviewed and are negative.  Physical Exam Updated Vital Signs BP (!) 138/94   Pulse (!) 105   Temp 97.8 F (36.6 C) (Oral)   Resp 17   Ht 1.778 m (5\' 10" )   Wt 92 kg   SpO2 100%   BMI 29.10 kg/m   Physical Exam Vitals and nursing note reviewed.  Constitutional:      Appearance: He is well-developed. He is ill-appearing. He is not diaphoretic.  HENT:     Head: Normocephalic and atraumatic.     Right Ear: External ear normal.     Left Ear: External ear normal.  Eyes:     General: No scleral icterus.       Right eye: No discharge.        Left eye: No discharge.  Conjunctiva/sclera: Conjunctivae normal.  Neck:     Trachea: No tracheal deviation.  Cardiovascular:     Rate and Rhythm: Normal rate and regular rhythm.  Pulmonary:     Effort: Pulmonary effort is normal. No respiratory distress.     Breath sounds: Normal breath sounds. No stridor. No wheezing or rales.  Abdominal:     General: Bowel sounds are normal. There is no distension.     Palpations: Abdomen is soft.     Tenderness: There is no abdominal tenderness. There is no guarding or rebound.  Musculoskeletal:        General: No tenderness or deformity.     Cervical back: Neck supple.  Skin:    General: Skin is warm and dry.     Findings: No rash.  Neurological:     General: No focal deficit present.     Mental Status: He is alert.     GCS: GCS eye subscore is 3. GCS verbal subscore is 4. GCS motor subscore is 6.     Cranial Nerves: No cranial nerve deficit (no facial droop,  extraocular movements intact, no slurred speech).     Sensory: No sensory deficit.     Motor: No abnormal muscle tone or seizure activity.     Comments: Patient does move both hands and both feet when I asked him to.  He will grip my hands, intermittent myoclonic spasms  Psychiatric:        Speech: Speech is delayed.    ED Results / Procedures / Treatments   Labs (all labs ordered are listed, but only abnormal results are displayed) Labs Reviewed  COMPREHENSIVE METABOLIC PANEL - Abnormal; Notable for the following components:      Result Value   Potassium 3.4 (*)    Glucose, Bld 262 (*)    Creatinine, Ser 1.25 (*)    Calcium 8.5 (*)    AST 48 (*)    ALT 50 (*)    Total Bilirubin 2.1 (*)    All other components within normal limits  SALICYLATE LEVEL - Abnormal; Notable for the following components:   Salicylate Lvl <7.0 (*)    All other components within normal limits  ACETAMINOPHEN LEVEL - Abnormal; Notable for the following components:   Acetaminophen (Tylenol), Serum <10 (*)    All other components within normal limits  RAPID URINE DRUG SCREEN, HOSP PERFORMED - Abnormal; Notable for the following components:   Amphetamines POSITIVE (*)    All other components within normal limits  CBG MONITORING, ED - Abnormal; Notable for the following components:   Glucose-Capillary 241 (*)    All other components within normal limits  ETHANOL  CBC WITH DIFFERENTIAL/PLATELET    EKG EKG Interpretation  Date/Time:  Monday December 15 2020 17:39:22 EDT Ventricular Rate:  107 PR Interval:  158 QRS Duration: 87 QT Interval:  350 QTC Calculation: 467 R Axis:   -15 Text Interpretation: Sinus tachycardia Borderline left axis deviation ST elev, probable normal early repol pattern Since last tracing rate faster Confirmed by Linwood Dibbles [phone]) on 12/15/2020 5:50:37 PM  Radiology No results found.  Procedures Procedures  CRITICAL CARE Performed by: Linwood Dibbles Total critical care time: 30  minutes Critical care time was exclusive of separately billable procedures and treating other patients. Critical care was necessary to treat or prevent imminent or life-threatening deterioration. Critical care was time spent personally by me on the following activities: development of treatment plan with patient and/or surrogate as well as nursing, discussions  with consultants, evaluation of patient's response to treatment, examination of patient, obtaining history from patient or surrogate, ordering and performing treatments and interventions, ordering and review of laboratory studies, ordering and review of radiographic studies, pulse oximetry and re-evaluation of patient's condition.  Medications Ordered in ED Medications  sodium chloride 0.9 % bolus 1,000 mL (0 mLs Intravenous Stopped 12/15/20 1917)  ondansetron (ZOFRAN) injection 4 mg (4 mg Intravenous Given 12/15/20 1745)  ondansetron (ZOFRAN-ODT) disintegrating tablet 4 mg (4 mg Oral Given 12/15/20 2205)    ED Course  I have reviewed the triage vital signs and the nursing notes.  Pertinent labs & imaging results that were available during my care of the patient were reviewed by me and considered in my medical decision making (see chart for details).  Clinical Course as of 12/16/20 0945  Mon Dec 15, 2020  2015 UDS positive for amphetamines [JK]  2145 Pt awake alert.  States ready for discharge.  Mentions drainage he has been experiencing around the umbilicus [JK]    Clinical Course User Index [JK] Linwood Dibbles, MD   MDM Rules/Calculators/A&P                          Presumed opiate overdose.  We will continue with a Narcan drip considering his diminished level of consciousness still.  Does not require any respiratory support at this time.  Question whether he might of had a stimulant ingestion as well with the spasm.  Pt monitored in the Ed.  Initially on narcan drip but eventually discontinued.  Mental status improved back to baseline.    Amphetamines noted in UA.    On dc pt mentioned some irriation at the umbilicus.  Mild erythema noted, purulent drainage.  NO sign of abscess.  Discussed local wound care , irritation.  Will rx abx.  Final Clinical Impression(s) / ED Diagnoses Final diagnoses:  Accidental drug overdose, initial encounter  Umbilicus discharge    Rx / DC Orders ED Discharge Orders          Ordered    ondansetron (ZOFRAN ODT) 8 MG disintegrating tablet  Every 8 hours PRN        12/15/20 2144    cephALEXin (KEFLEX) 500 MG capsule  4 times daily        12/15/20 2144             Linwood Dibbles, MD 12/16/20 0945

## 2020-12-19 ENCOUNTER — Encounter (HOSPITAL_COMMUNITY): Payer: Self-pay | Admitting: *Deleted

## 2020-12-19 ENCOUNTER — Emergency Department (HOSPITAL_COMMUNITY): Payer: Medicaid - Out of State

## 2020-12-19 ENCOUNTER — Emergency Department (HOSPITAL_COMMUNITY)
Admission: EM | Admit: 2020-12-19 | Discharge: 2020-12-19 | Disposition: A | Discharge status: Home / Self Care | Payer: Medicaid - Out of State | Attending: Emergency Medicine | Admitting: Emergency Medicine

## 2020-12-19 ENCOUNTER — Emergency Department (HOSPITAL_COMMUNITY)
Admission: EM | Admit: 2020-12-19 | Discharge: 2020-12-19 | Disposition: A | Discharge status: Home / Self Care | Payer: Medicaid - Out of State | Source: Home / Self Care | Attending: Emergency Medicine | Admitting: Emergency Medicine

## 2020-12-19 DIAGNOSIS — T43621A Poisoning by amphetamines, accidental (unintentional), initial encounter: Secondary | ICD-10-CM | POA: Diagnosis not present

## 2020-12-19 DIAGNOSIS — R519 Headache, unspecified: Secondary | ICD-10-CM | POA: Insufficient documentation

## 2020-12-19 DIAGNOSIS — R451 Restlessness and agitation: Secondary | ICD-10-CM | POA: Insufficient documentation

## 2020-12-19 DIAGNOSIS — G4440 Drug-induced headache, not elsewhere classified, not intractable: Secondary | ICD-10-CM | POA: Insufficient documentation

## 2020-12-19 DIAGNOSIS — F17210 Nicotine dependence, cigarettes, uncomplicated: Secondary | ICD-10-CM | POA: Insufficient documentation

## 2020-12-19 DIAGNOSIS — G44201 Tension-type headache, unspecified, intractable: Secondary | ICD-10-CM

## 2020-12-19 DIAGNOSIS — F1510 Other stimulant abuse, uncomplicated: Secondary | ICD-10-CM

## 2020-12-19 MED ORDER — IBUPROFEN 800 MG PO TABS
800.0000 mg | ORAL_TABLET | Freq: Once | ORAL | Status: AC
  Administered 2020-12-19: 800 mg via ORAL
  Filled 2020-12-19: qty 1

## 2020-12-19 MED ORDER — ACETAMINOPHEN 325 MG PO TABS
650.0000 mg | ORAL_TABLET | Freq: Once | ORAL | Status: AC
  Administered 2020-12-19: 650 mg via ORAL
  Filled 2020-12-19: qty 2

## 2020-12-19 NOTE — ED Triage Notes (Signed)
Pt with continued HA and seeing colors differently.  Pt still paranoid in triage. Pt just released about an hour ago.

## 2020-12-19 NOTE — Discharge Instructions (Addendum)
Take Tylenol for pain and follow-up with your doctor if any problems.  Your CAT scan of your head was completely normal

## 2020-12-19 NOTE — ED Provider Notes (Signed)
[facility] EMERGENCY DEPARTMENT Provider Note   CSN: [id number] Arrival date & time: 12/19/20  9937     History No chief complaint on file.   Scott Huber is a 30 y.o. male.  Patient has been using methamphetamines and complains of a headache.  The history is provided by the patient and medical records. No language interpreter was used.  Headache Pain location:  Generalized Quality:  Dull Radiates to:  Does not radiate Severity currently:  3/10 Severity at highest:  6/10 Onset quality:  Sudden Timing:  Constant Progression:  Waxing and waning Chronicity:  New Similar to prior headaches: no   Context: not activity   Relieved by:  Nothing Worsened by:  Nothing Associated symptoms: no abdominal pain, no back pain, no congestion, no cough, no diarrhea, no fatigue, no seizures and no sinus pressure       Past Medical History:  Diagnosis Date   ADHD    Anxiety    Bipolar 1 disorder (HCC)    Genital herpes    Panic attack    Stimulant abuse [facility])     Patient Active Problem List   Diagnosis Date Noted   Polysubstance abuse (HCC) 05/02/2019    No past surgical history on file.     No family history on file.  Social History   Tobacco Use   Smoking status: Every Day    Packs/day: 1.00    Types: Cigarettes   Smokeless tobacco: Never  Vaping Use   Vaping Use: Never used  Substance Use Topics   Alcohol use: Yes    Comment: occ   Drug use: Yes    Types: Cocaine, Methamphetamines    Comment: heroin    Home Medications Prior to Admission medications   Medication Sig Start Date End Date Taking? Authorizing Provider  acetaminophen (TYLENOL) 500 MG tablet Take 1,000 mg by mouth every 6 (six) hours as needed for mild pain or headache.   Yes [provider]  cephALEXin (KEFLEX) 500 MG capsule Take 1 capsule (500 mg total) by mouth 4 (four) times daily. Patient not taking: No sig reported 12/15/20   Linwood Dibbles, MD  ondansetron (ZOFRAN ODT) 8 MG  disintegrating tablet Take 1 tablet (8 mg total) by mouth every 8 (eight) hours as needed for nausea or vomiting. Patient not taking: No sig reported 12/15/20   Linwood Dibbles, MD    Allergies    Hydrocodone and Vistaril [hydroxyzine hcl]  Review of Systems   Review of Systems  Constitutional:  Negative for appetite change and fatigue.  HENT:  Negative for congestion, ear discharge and sinus pressure.   Eyes:  Negative for discharge.  Respiratory:  Negative for cough.   Cardiovascular:  Negative for chest pain.  Gastrointestinal:  Negative for abdominal pain and diarrhea.  Genitourinary:  Negative for frequency and hematuria.  Musculoskeletal:  Negative for back pain.  Skin:  Negative for rash.  Neurological:  Positive for headaches. Negative for seizures.  Psychiatric/Behavioral:  Negative for hallucinations.    Physical Exam Updated Vital Signs BP (!) 163/98   Pulse (!) 113   Temp 98.2 F (36.8 C) (Oral)   Resp 18   Ht 5\' 10"  (1.778 m)   Wt 92 kg   SpO2 99%   BMI 29.10 kg/m   Physical Exam Vitals and nursing note reviewed.  Constitutional:      Appearance: He is well-developed.  HENT:     Head: Normocephalic.     Nose: Nose normal.  Eyes:     General: No scleral icterus.    Conjunctiva/sclera: Conjunctivae normal.  Neck:     Thyroid: No thyromegaly.  Cardiovascular:     Rate and Rhythm: Normal rate and regular rhythm.     Heart sounds: No murmur heard.   No friction rub. No gallop.  Pulmonary:     Breath sounds: No stridor. No wheezing or rales.  Chest:     Chest wall: No tenderness.  Abdominal:     General: There is no distension.     Tenderness: There is no abdominal tenderness. There is no rebound.  Musculoskeletal:        General: Normal range of motion.     Cervical back: Neck supple.  Lymphadenopathy:     Cervical: No cervical adenopathy.  Skin:    Findings: No erythema or rash.  Neurological:     Mental Status: He is alert and oriented to person,  place, and time.     Motor: No abnormal muscle tone.     Coordination: Coordination normal.  Psychiatric:     Comments: Patient mildly agitated but not homicidal or suicidal and not hallucinating    ED Results / Procedures / Treatments   Labs (all labs ordered are listed, but only abnormal results are displayed) Labs Reviewed - No data to display  EKG None  Radiology CT HEAD WO CONTRAST ( )  Result Date: 12/19/2020 CLINICAL DATA:  Headache, intracranial hemorrhage suspected. Additional history provided: Patient reports head pain. EXAM: CT HEAD WITHOUT CONTRAST TECHNIQUE: Contiguous axial images were obtained from the base of the skull through the vertex without intravenous contrast. COMPARISON:  Head CT 07/22/2018. Report from head CT 06/10/2019 (images unavailable). FINDINGS: Brain: Cerebral volume is normal. There is no acute intracranial hemorrhage. No demarcated cortical infarct. No extra-axial fluid collection. No evidence of an intracranial mass. No midline shift. Vascular: No hyperdense vessel. Skull: Normal. Negative for fracture or focal lesion. Sinuses/Orbits: Visualized orbits show no acute finding. Mild mucosal thickening within a posterior right ethmoid air cell and within the imaged bilateral maxillary sinuses. IMPRESSION: Unremarkable non-contrast CT appearance of the brain. No evidence of acute intracranial abnormality. Mild paranasal sinus disease at the imaged levels, as described. Electronically Signed   By: Jackey Loge DO   On: 12/19/2020 09:47    Procedures Procedures   Medications Ordered in ED Medications  acetaminophen (TYLENOL) tablet 650 mg (has no administration in time range)    ED Course  I have reviewed the triage vital signs and the nursing notes.  Pertinent labs & imaging results that were available during my care of the patient were reviewed by me and considered in my medical decision making (see chart for details).    MDM Rules/Calculators/A&P                            CT head negative.  Patient is not homicidal or suicidal and not hallucinating right now.  Suspect his headache is related to taking methamphetamines.  His blood pressure is mildly elevated.  He is given Tylenol follow-up with PCP Final Clinical Impression(s) / ED Diagnoses Final diagnoses:  None    Rx / DC Orders ED Discharge Orders     None        Bethann Berkshire, MD 12/19/20 1058

## 2020-12-19 NOTE — ED Provider Notes (Signed)
[facility] EMERGENCY DEPARTMENT Provider Note   CSN: [id number] Arrival date & time: 12/19/20  1459     History Chief Complaint  Patient presents with   Headache    Scott Huber is a 30 y.o. male with a past medical history of anxiety and substance abuse presenting with a complaint of a headache that began early this morning.  Seen here earlier today by MD Zammit, with a negative head CT.  Patient states that it did not get better. Endorses daily methamphetamine use. Last use today. States "there must be something going on in there this is different".  Endorses visual changes but cannot describe them.  No nausea or vomiting.  Endorses feeling lightheaded but no syncope.  Says that the headache is pressure all over the head.  This has not happened before.  Has not taken anything to try and help it other than what he received during his previous visit this morning.   Headache Associated symptoms: no abdominal pain, no congestion, no dizziness, no fatigue, no fever, no nausea, no numbness, no photophobia, no vomiting and no weakness       Past Medical History:  Diagnosis Date   ADHD    Anxiety    Bipolar 1 disorder (HCC)    Genital herpes    Panic attack    Stimulant abuse [facility])     Patient Active Problem List   Diagnosis Date Noted   Polysubstance abuse (HCC) 05/02/2019    History reviewed. No pertinent surgical history.     History reviewed. No pertinent family history.  Social History   Tobacco Use   Smoking status: Every Day    Packs/day: 1.00    Types: Cigarettes   Smokeless tobacco: Never  Vaping Use   Vaping Use: Never used  Substance Use Topics   Alcohol use: Yes    Comment: occ   Drug use: Yes    Types: Cocaine, Methamphetamines    Comment: heroin    Home Medications Prior to Admission medications   Medication Sig Start Date End Date Taking? Authorizing Provider  acetaminophen (TYLENOL) 500 MG tablet Take 1,000 mg by mouth every 6 (six) hours as  needed for mild pain or headache.    [provider]  cephALEXin (KEFLEX) 500 MG capsule Take 1 capsule (500 mg total) by mouth 4 (four) times daily. Patient not taking: No sig reported 12/15/20   Linwood Dibbles, MD  ondansetron (ZOFRAN ODT) 8 MG disintegrating tablet Take 1 tablet (8 mg total) by mouth every 8 (eight) hours as needed for nausea or vomiting. Patient not taking: No sig reported 12/15/20   Linwood Dibbles, MD    Allergies    Hydrocodone and Vistaril [hydroxyzine hcl]  Review of Systems   Review of Systems  Constitutional:  Negative for chills, diaphoresis, fatigue and fever.  HENT:  Negative for congestion and tinnitus.   Eyes:  Positive for visual disturbance (cannot describe). Negative for photophobia.  Respiratory:  Negative for chest tightness, shortness of breath and wheezing.   Cardiovascular:  Negative for chest pain and palpitations.  Gastrointestinal:  Negative for abdominal pain, nausea and vomiting.  Skin:  Negative for rash.  Neurological:  Positive for light-headedness and headaches. Negative for dizziness, syncope, facial asymmetry, weakness and numbness.  Psychiatric/Behavioral:  Positive for agitation and behavioral problems (daily drug use). The patient is nervous/anxious.    Physical Exam Updated Vital Signs BP (!) 157/112 (BP Location: Right Arm)   Pulse (!) 102   Temp  98.5 F (36.9 C) (Oral)   Resp 18   SpO2 100%   Physical Exam Vitals and nursing note reviewed.  Constitutional:      Appearance: Normal appearance.  HENT:     Head: Normocephalic and atraumatic.  Eyes:     General: No scleral icterus.    Extraocular Movements: Extraocular movements intact.     Conjunctiva/sclera: Conjunctivae normal.     Pupils: Pupils are equal, round, and reactive to light.  Cardiovascular:     Rate and Rhythm: Normal rate and regular rhythm.     Heart sounds: Normal heart sounds. No murmur heard. Pulmonary:     Effort: Pulmonary effort is normal. No  respiratory distress.  Abdominal:     Palpations: Abdomen is soft.  Musculoskeletal:     Cervical back: Normal range of motion. No rigidity.  Skin:    General: Skin is warm and dry.  Neurological:     Mental Status: He is alert.     GCS: GCS eye subscore is 4. GCS verbal subscore is 5. GCS motor subscore is 6.     Cranial Nerves: No cranial nerve deficit (2-12 grossly intact).     Coordination: Coordination normal.     Gait: Gait normal.  Psychiatric:        Mood and Affect: Mood is anxious.    ED Results / Procedures / Treatments   Labs (all labs ordered are listed, but only abnormal results are displayed) Labs Reviewed - No data to display  EKG None  Radiology CT HEAD WO CONTRAST ( )  Result Date: 12/19/2020 CLINICAL DATA:  Headache, intracranial hemorrhage suspected. Additional history provided: Patient reports head pain. EXAM: CT HEAD WITHOUT CONTRAST TECHNIQUE: Contiguous axial images were obtained from the base of the skull through the vertex without intravenous contrast. COMPARISON:  Head CT 07/22/2018. Report from head CT 06/10/2019 (images unavailable). FINDINGS: Brain: Cerebral volume is normal. There is no acute intracranial hemorrhage. No demarcated cortical infarct. No extra-axial fluid collection. No evidence of an intracranial mass. No midline shift. Vascular: No hyperdense vessel. Skull: Normal. Negative for fracture or focal lesion. Sinuses/Orbits: Visualized orbits show no acute finding. Mild mucosal thickening within a posterior right ethmoid air cell and within the imaged bilateral maxillary sinuses. IMPRESSION: Unremarkable non-contrast CT appearance of the brain. No evidence of acute intracranial abnormality. Mild paranasal sinus disease at the imaged levels, as described. Electronically Signed   By: Jackey Loge DO   On: 12/19/2020 09:47    Procedures Procedures   Medications Ordered in ED Medications  ibuprofen (ADVIL) tablet 800 mg (800 mg Oral Given  12/19/20 1847)    ED Course  I have reviewed the triage vital signs and the nursing notes.  Pertinent labs & imaging results that were available during my care of the patient were reviewed by me and considered in my medical decision making (see chart for details).    MDM Rules/Calculators/A&P Patient presents hours after his visit this morning.  He was reassured that his scan from earlier today showed no abnormalities.  He admits to using methamphetamine daily, sometimes multiple times a day.  Due to his normal neuro exam, negative head CT, AV hallucinations, and denial of homicidal/suicidal thoughts patient safe for discharge.  Blood pressure elevated, similar to this morning.  Heart rate slightly elevated, but decreased from this morning.  Presumably due to recent methamphetamine use.  Patient given ibuprofen 800 mg.  I will give him information about methamphetamine use and resources to stop using.  Final Clinical Impression(s) / ED Diagnoses Final diagnoses:  Methamphetamine abuse (HCC)  Drug-induced headache, not elsewhere classified, not intractable    Rx / DC Orders Results and diagnoses were explained to the patient. Return precautions discussed in full. Patient had no additional questions and expressed complete understanding.     Woodroe Chen 12/19/20 1906    Eber Hong, MD 12/20/20 1220

## 2020-12-19 NOTE — ED Triage Notes (Signed)
Pt complaining of headpain, pt has been taking meth, pt paranoid in triage.  Asking this nurse if I can see what is in his head.  Pt alert and oriented, skin warm

## 2020-12-29 ENCOUNTER — Emergency Department (HOSPITAL_COMMUNITY)
Admission: EM | Admit: 2020-12-29 | Discharge: 2020-12-29 | Disposition: A | Discharge status: Home / Self Care | Payer: Medicaid - Out of State | Source: Home / Self Care | Attending: Emergency Medicine | Admitting: Emergency Medicine

## 2020-12-29 ENCOUNTER — Other Ambulatory Visit: Payer: Self-pay

## 2020-12-29 ENCOUNTER — Encounter (HOSPITAL_COMMUNITY): Payer: Self-pay | Admitting: Emergency Medicine

## 2020-12-29 ENCOUNTER — Encounter (HOSPITAL_COMMUNITY): Payer: Self-pay

## 2020-12-29 ENCOUNTER — Emergency Department (HOSPITAL_COMMUNITY)
Admission: EM | Admit: 2020-12-29 | Discharge: 2020-12-29 | Disposition: A | Discharge status: Home / Self Care | Payer: Medicaid - Out of State | Attending: Emergency Medicine | Admitting: Emergency Medicine

## 2020-12-29 DIAGNOSIS — F1510 Other stimulant abuse, uncomplicated: Secondary | ICD-10-CM | POA: Insufficient documentation

## 2020-12-29 DIAGNOSIS — Z046 Encounter for general psychiatric examination, requested by authority: Secondary | ICD-10-CM | POA: Diagnosis present

## 2020-12-29 DIAGNOSIS — M791 Myalgia, unspecified site: Secondary | ICD-10-CM | POA: Insufficient documentation

## 2020-12-29 DIAGNOSIS — F17210 Nicotine dependence, cigarettes, uncomplicated: Secondary | ICD-10-CM | POA: Insufficient documentation

## 2020-12-29 DIAGNOSIS — F19950 Other psychoactive substance use, unspecified with psychoactive substance-induced psychotic disorder with delusions: Secondary | ICD-10-CM

## 2020-12-29 MED ORDER — ACETAMINOPHEN 325 MG PO TABS
650.0000 mg | ORAL_TABLET | Freq: Once | ORAL | Status: AC
  Administered 2020-12-29: 650 mg via ORAL
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
## 2021-11-10 ENCOUNTER — Emergency Department (HOSPITAL_COMMUNITY): Payer: Medicaid - Out of State

## 2021-11-10 ENCOUNTER — Encounter (HOSPITAL_COMMUNITY): Payer: Self-pay | Admitting: *Deleted

## 2021-11-10 ENCOUNTER — Emergency Department (HOSPITAL_COMMUNITY)
Admission: EM | Admit: 2021-11-10 | Discharge: 2021-11-10 | Disposition: A | Discharge status: Home / Self Care | Payer: Self-pay | Attending: Emergency Medicine | Admitting: Emergency Medicine

## 2021-11-10 ENCOUNTER — Other Ambulatory Visit: Payer: Self-pay

## 2021-11-10 DIAGNOSIS — I10 Essential (primary) hypertension: Secondary | ICD-10-CM | POA: Insufficient documentation

## 2021-11-10 DIAGNOSIS — R7309 Other abnormal glucose: Secondary | ICD-10-CM | POA: Insufficient documentation

## 2021-11-10 DIAGNOSIS — F1510 Other stimulant abuse, uncomplicated: Secondary | ICD-10-CM | POA: Insufficient documentation

## 2021-11-10 DIAGNOSIS — R000 Tachycardia, unspecified: Secondary | ICD-10-CM | POA: Insufficient documentation

## 2021-11-10 DIAGNOSIS — R519 Headache, unspecified: Secondary | ICD-10-CM | POA: Insufficient documentation

## 2021-11-10 LAB — CBC WITH DIFFERENTIAL/PLATELET
Abs Immature Granulocytes: 0.05 10*3/uL (ref 0.00–0.07)
Basophils Absolute: 0.1 10*3/uL (ref 0.0–0.1)
Basophils Relative: 1 %
Eosinophils Absolute: 0.4 10*3/uL (ref 0.0–0.5)
Eosinophils Relative: 4 %
HCT: 46.1 % (ref 39.0–52.0)
Hemoglobin: 16.6 g/dL (ref 13.0–17.0)
Immature Granulocytes: 1 %
Lymphocytes Relative: 23 %
Lymphs Abs: 2.1 10*3/uL (ref 0.7–4.0)
MCH: 31.3 pg (ref 26.0–34.0)
MCHC: 36 g/dL (ref 30.0–36.0)
MCV: 86.8 fL (ref 80.0–100.0)
Monocytes Absolute: 0.9 10*3/uL (ref 0.1–1.0)
Monocytes Relative: 10 %
Neutro Abs: 5.6 10*3/uL (ref 1.7–7.7)
Neutrophils Relative %: 61 %
Platelets: 337 10*3/uL (ref 150–400)
RBC: 5.31 MIL/uL (ref 4.22–5.81)
RDW: 12.2 % (ref 11.5–15.5)
WBC: 9.2 10*3/uL (ref 4.0–10.5)
nRBC: 0 % (ref 0.0–0.2)

## 2021-11-10 LAB — BASIC METABOLIC PANEL
Anion gap: 10 (ref 5–15)
BUN: 13 mg/dL (ref 6–20)
CO2: 23 mmol/L (ref 22–32)
Calcium: 9.6 mg/dL (ref 8.9–10.3)
Chloride: 102 mmol/L (ref 98–111)
Creatinine, Ser: 1.18 mg/dL (ref 0.61–1.24)
GFR, Estimated: >60 mL/min (ref >60)
Glucose, Bld: 103 mg/dL — ABNORMAL HIGH (ref 70–99)
Potassium: 3.9 mmol/L (ref 3.5–5.1)
Sodium: 135 mmol/L (ref 135–145)

## 2021-11-10 MED ORDER — SODIUM CHLORIDE 0.9 % IV BOLUS
1000.0000 mL | Freq: Once | INTRAVENOUS | Status: AC
  Administered 2021-11-10: 1000 mL via INTRAVENOUS

## 2021-11-10 MED ORDER — LORAZEPAM 2 MG/ML IJ SOLN
1.0000 mg | Freq: Once | INTRAMUSCULAR | Status: AC
  Administered 2021-11-10: 1 mg via INTRAVENOUS
  Filled 2021-11-10: qty 1
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
## 2021-11-10 NOTE — Discharge Instructions (Addendum)
Please stop using methamphetamine.  This is a very harmful drug that can kill you.  Please return to the emergency department for any worsening symptoms you might have.  Your imaging of your head was normal today in addition to your blood work.

## 2021-11-25 ENCOUNTER — Encounter (HOSPITAL_COMMUNITY): Payer: Self-pay | Admitting: Emergency Medicine

## 2021-11-25 ENCOUNTER — Other Ambulatory Visit: Payer: Self-pay

## 2021-11-25 ENCOUNTER — Emergency Department (HOSPITAL_COMMUNITY)
Admission: EM | Admit: 2021-11-25 | Discharge: 2021-11-25 | Disposition: A | Discharge status: Home / Self Care | Payer: Self-pay | Attending: Emergency Medicine | Admitting: Emergency Medicine

## 2021-11-25 DIAGNOSIS — F22 Delusional disorders: Secondary | ICD-10-CM | POA: Insufficient documentation

## 2021-11-25 DIAGNOSIS — F1510 Other stimulant abuse, uncomplicated: Secondary | ICD-10-CM | POA: Insufficient documentation

## 2021-11-25 LAB — CBC
HCT: 42.6 % (ref 39.0–52.0)
Hemoglobin: 14.5 g/dL (ref 13.0–17.0)
MCH: 30.9 pg (ref 26.0–34.0)
MCHC: 34 g/dL (ref 30.0–36.0)
MCV: 90.8 fL (ref 80.0–100.0)
Platelets: 443 10*3/uL — ABNORMAL HIGH (ref 150–400)
RBC: 4.69 MIL/uL (ref 4.22–5.81)
RDW: 12.2 % (ref 11.5–15.5)
WBC: 9.8 10*3/uL (ref 4.0–10.5)
nRBC: 0 % (ref 0.0–0.2)

## 2021-11-25 LAB — BASIC METABOLIC PANEL
Anion gap: 9 (ref 5–15)
BUN: 9 mg/dL (ref 6–20)
CO2: 26 mmol/L (ref 22–32)
Calcium: 9.5 mg/dL (ref 8.9–10.3)
Chloride: 106 mmol/L (ref 98–111)
Creatinine, Ser: 1.06 mg/dL (ref 0.61–1.24)
GFR, Estimated: >60 mL/min (ref >60)
Glucose, Bld: 116 mg/dL — ABNORMAL HIGH (ref 70–99)
Potassium: 3.6 mmol/L (ref 3.5–5.1)
Sodium: 141 mmol/L (ref 135–145)

## 2021-11-25 MED ORDER — ZIPRASIDONE MESYLATE 20 MG IM SOLR
20.0000 mg | Freq: Once | INTRAMUSCULAR | Status: AC
  Administered 2021-11-25: 20 mg via INTRAMUSCULAR
  Filled 2021-11-25: qty 20

## 2021-11-25 MED ORDER — LORAZEPAM 2 MG/ML IJ SOLN
1.0000 mg | Freq: Once | INTRAMUSCULAR | Status: AC
  Administered 2021-11-25: 1 mg via INTRAMUSCULAR
  Filled 2021-11-25: qty 1

## 2021-11-25 NOTE — ED Triage Notes (Signed)
Pt reports "needing help with my mental state." Pt reports smoking meth this morning. Pt reports auditory hallucinations.

## 2021-11-25 NOTE — Discharge Instructions (Addendum)
Avoid using methamphetamine.  Please consider following up with an out patient treatment center.
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
## 2021-11-25 NOTE — ED Notes (Signed)
Pt resting quietly at this time. Even rise and fall of chest. Will continue to monitor.

## 2021-11-26 ENCOUNTER — Emergency Department (HOSPITAL_COMMUNITY): Payer: Self-pay

## 2021-11-26 ENCOUNTER — Encounter (HOSPITAL_COMMUNITY): Payer: Self-pay | Admitting: Emergency Medicine

## 2021-11-26 ENCOUNTER — Other Ambulatory Visit: Payer: Self-pay

## 2021-11-26 ENCOUNTER — Emergency Department (HOSPITAL_COMMUNITY)
Admission: EM | Admit: 2021-11-26 | Discharge: 2021-11-26 | Disposition: A | Discharge status: Home / Self Care | Payer: Self-pay | Attending: Emergency Medicine | Admitting: Emergency Medicine

## 2021-11-26 DIAGNOSIS — S93492A Sprain of other ligament of left ankle, initial encounter: Secondary | ICD-10-CM

## 2021-11-26 DIAGNOSIS — F1510 Other stimulant abuse, uncomplicated: Secondary | ICD-10-CM

## 2021-11-26 DIAGNOSIS — E876 Hypokalemia: Secondary | ICD-10-CM

## 2021-11-26 DIAGNOSIS — F1994 Other psychoactive substance use, unspecified with psychoactive substance-induced mood disorder: Secondary | ICD-10-CM

## 2021-11-26 DIAGNOSIS — Z79899 Other long term (current) drug therapy: Secondary | ICD-10-CM | POA: Insufficient documentation

## 2021-11-26 DIAGNOSIS — L03116 Cellulitis of left lower limb: Secondary | ICD-10-CM | POA: Insufficient documentation

## 2021-11-26 DIAGNOSIS — F28 Other psychotic disorder not due to a substance or known physiological condition: Secondary | ICD-10-CM | POA: Insufficient documentation

## 2021-11-26 LAB — C-REACTIVE PROTEIN: CRP: 6.1 mg/dL — ABNORMAL HIGH (ref <1.0)

## 2021-11-26 LAB — CBC WITH DIFFERENTIAL/PLATELET
Abs Immature Granulocytes: 0.03 10*3/uL (ref 0.00–0.07)
Basophils Absolute: 0.1 10*3/uL (ref 0.0–0.1)
Basophils Relative: 1 %
Eosinophils Absolute: 0.4 10*3/uL (ref 0.0–0.5)
Eosinophils Relative: 4 %
HCT: 43.9 % (ref 39.0–52.0)
Hemoglobin: 15 g/dL (ref 13.0–17.0)
Immature Granulocytes: 0 %
Lymphocytes Relative: 18 %
Lymphs Abs: 1.6 10*3/uL (ref 0.7–4.0)
MCH: 31.4 pg (ref 26.0–34.0)
MCHC: 34.2 g/dL (ref 30.0–36.0)
MCV: 91.8 fL (ref 80.0–100.0)
Monocytes Absolute: 0.8 10*3/uL (ref 0.1–1.0)
Monocytes Relative: 9 %
Neutro Abs: 6.2 10*3/uL (ref 1.7–7.7)
Neutrophils Relative %: 68 %
Platelets: 428 10*3/uL — ABNORMAL HIGH (ref 150–400)
RBC: 4.78 MIL/uL (ref 4.22–5.81)
RDW: 12.4 % (ref 11.5–15.5)
WBC: 9.2 10*3/uL (ref 4.0–10.5)
nRBC: 0 % (ref 0.0–0.2)

## 2021-11-26 LAB — COMPREHENSIVE METABOLIC PANEL
ALT: 47 U/L — ABNORMAL HIGH (ref 0–44)
AST: 32 U/L (ref 15–41)
Albumin: 4.8 g/dL (ref 3.5–5.0)
Alkaline Phosphatase: 56 U/L (ref 38–126)
Anion gap: 10 (ref 5–15)
BUN: 8 mg/dL (ref 6–20)
CO2: 27 mmol/L (ref 22–32)
Calcium: 9.1 mg/dL (ref 8.9–10.3)
Chloride: 101 mmol/L (ref 98–111)
Creatinine, Ser: 0.91 mg/dL (ref 0.61–1.24)
GFR, Estimated: >60 mL/min (ref >60)
Glucose, Bld: 98 mg/dL (ref 70–99)
Potassium: 2.9 mmol/L — ABNORMAL LOW (ref 3.5–5.1)
Sodium: 138 mmol/L (ref 135–145)
Total Bilirubin: 0.9 mg/dL (ref 0.3–1.2)
Total Protein: 8.8 g/dL — ABNORMAL HIGH (ref 6.5–8.1)

## 2021-11-26 LAB — LACTIC ACID, PLASMA: Lactic Acid, Venous: 1 mmol/L (ref 0.5–1.9)

## 2021-11-26 LAB — ETHANOL: Alcohol, Ethyl (B): <10 mg/dL (ref <10)

## 2021-11-26 LAB — RAPID URINE DRUG SCREEN, HOSP PERFORMED
Amphetamines: POSITIVE — AB
Barbiturates: NOT DETECTED
Benzodiazepines: POSITIVE — AB
Cocaine: NOT DETECTED
Opiates: NOT DETECTED
Tetrahydrocannabinol: NOT DETECTED

## 2021-11-26 LAB — SEDIMENTATION RATE: Sed Rate: 60 mm/hr — ABNORMAL HIGH (ref 0–16)

## 2021-11-26 MED ORDER — ZIPRASIDONE MESYLATE 20 MG IM SOLR
20.0000 mg | Freq: Once | INTRAMUSCULAR | Status: DC
  Filled 2021-11-26: qty 20

## 2021-11-26 MED ORDER — GADOBUTROL 1 MMOL/ML IV SOLN
9.0000 mL | Freq: Once | INTRAVENOUS | Status: AC | PRN
Start: 2021-11-26 — End: 2021-11-26
  Administered 2021-11-26: 9 mL via INTRAVENOUS

## 2021-11-26 MED ORDER — LORAZEPAM 2 MG/ML IJ SOLN
2.0000 mg | Freq: Once | INTRAMUSCULAR | Status: DC
  Filled 2021-11-26: qty 1

## 2021-11-26 MED ORDER — CEPHALEXIN 500 MG PO CAPS
1000.0000 mg | ORAL_CAPSULE | Freq: Once | ORAL | Status: AC
  Administered 2021-11-26: 1000 mg via ORAL
  Filled 2021-11-26: qty 2

## 2021-11-26 MED ORDER — IBUPROFEN 400 MG PO TABS
400.0000 mg | ORAL_TABLET | Freq: Once | ORAL | Status: AC
Start: 2021-11-26 — End: 2021-11-26
  Administered 2021-11-26: 400 mg via ORAL
  Filled 2021-11-26: qty 1

## 2021-11-26 MED ORDER — STERILE WATER FOR INJECTION IJ SOLN
INTRAMUSCULAR | Status: AC
  Filled 2021-11-26: qty 10

## 2021-11-26 MED ORDER — POTASSIUM CHLORIDE CRYS ER 20 MEQ PO TBCR
40.0000 meq | EXTENDED_RELEASE_TABLET | Freq: Once | ORAL | Status: AC
  Administered 2021-11-26: 40 meq via ORAL
  Filled 2021-11-26: qty 2

## 2021-11-26 MED ORDER — CEPHALEXIN 500 MG PO CAPS
500.0000 mg | ORAL_CAPSULE | Freq: Four times a day (QID) | ORAL | 0 refills | Status: DC
Start: 2021-11-26 — End: 2023-09-15

## 2021-11-26 NOTE — ED Notes (Signed)
Pt in room now . 
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
## 2021-11-26 NOTE — ED Provider Notes (Signed)
Patient signed out by Dr Blinda Leatherwood that recent injury to ankle, possible cellulitis, no fx on xray but getting mri to assess for injury/acute process, check mri, add po abx therapy, and d/c.    Possible ligamentous injury on mri w associated edema. On exam, there is dependent bruising to foot, and also erythema about ankle. No crepitus. No abscess. Slow passive rom ankle without acute/severe pain. No fever. No hx ivda.   Pt is alert, oriented. Normal vitals. No thoughts of harm to self or others. No delusions or hallucinations noted - pt does not appear to be responding to internal stimuli.   Pt currently appears stable for d/c.   Return precautions provided.      Cathren Laine, MD 11/26/21 [phone]

## 2021-11-26 NOTE — ED Notes (Signed)
Pt wants psych evaluation because he states he is seeing things that aren't there as well as hearing voices.

## 2021-11-26 NOTE — ED Notes (Signed)
Scott Leatherwood, MD notified re: K+ 2.9

## 2021-11-26 NOTE — Discharge Instructions (Addendum)
It was our pleasure to provide your ER care today - we hope that you feel better.  Wear ankle brace/support as need for the next few days, use crutches as need. Elevate foot, cold/icepack to sore area.   Take keflex (antibiotic) as prescribed.  Take acetaminophen or ibuprofen as need.   Your imaging was read as as showing: Disruption and edema of the anterior talofibular ligament  suggesting full-thickness tear. Follow up closely with orthopedic doctor in the next 2-3 days - call office today to arrange appointment.   Your potassium level is low - eat plenty of fruits and vegetables, and follow up with primary care doctor in one week.   Avoid drug use as it is harmful to your physical health and mental well-being. See resources attached for treatment programs. Also follow up closely with behavioral health specialist in the coming week.   Return to ER right away if worse, new symptoms, fevers, spreading redness, increased swelling, severe pain, or other concern.
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
## 2021-11-26 NOTE — ED Notes (Signed)
MD speaking with pt re: plan of care

## 2021-11-27 ENCOUNTER — Encounter (HOSPITAL_COMMUNITY): Payer: Self-pay | Admitting: Emergency Medicine

## 2021-11-27 ENCOUNTER — Emergency Department (HOSPITAL_COMMUNITY)
Admission: EM | Admit: 2021-11-27 | Discharge: 2021-11-27 | Disposition: A | Discharge status: Home / Self Care | Payer: Self-pay | Attending: Emergency Medicine | Admitting: Emergency Medicine

## 2021-11-27 DIAGNOSIS — R443 Hallucinations, unspecified: Secondary | ICD-10-CM | POA: Insufficient documentation

## 2021-11-27 DIAGNOSIS — F1510 Other stimulant abuse, uncomplicated: Secondary | ICD-10-CM | POA: Insufficient documentation

## 2021-11-27 NOTE — ED Notes (Signed)
Pt being informed he was being discharged and pt not happy.

## 2021-11-27 NOTE — ED Triage Notes (Addendum)
Pt reports he is having auditory and visual hallucinations. Pt reports using meth yesterday.  Pt states he wants to get help. Denies SI/HI.

## 2021-11-27 NOTE — ED Provider Notes (Signed)
[facility] EMERGENCY DEPARTMENT Provider Note   CSN: [id number] Arrival date & time: 11/27/21  1259     History  No chief complaint on file.   Scott Huber is a 31 y.o. male with a past medical history of polysubstance abuse who presents with complaints of hallucinations.  Patient has been seen in the past for the same complaint.  Patient states "they just will not admit me."  He admits to using methamphetamine.  Patient states that his hallucinations have been going on for "a while" but will not give me a timeline.  Patient states that he has been using methamphetamine for at least the past 3 years.  He is somnolent at this time after not sleeping very much.  He denies suicidal or homicidal ideation.  He has not sought help in the outpatient setting.  HPI     Home Medications Prior to Admission medications   Medication Sig Start Date End Date Taking? Authorizing Provider  acetaminophen (TYLENOL) 500 MG tablet Take 1,000 mg by mouth every 6 (six) hours as needed for mild pain or headache.    [provider]  cephALEXin (KEFLEX) 500 MG capsule Take 1 capsule (500 mg total) by mouth 4 (four) times daily. 11/26/21   Cathren Laine, MD      Allergies    Hydrocodone and Vistaril [hydroxyzine hcl]    Review of Systems   Review of Systems  Physical Exam Updated Vital Signs BP (!) 131/95 (BP Location: Right Arm)   Pulse (!) 107   Temp 98.1 F (36.7 C) (Oral)   Resp 18   SpO2 100%  Physical Exam Vitals and nursing note reviewed.  Constitutional:      General: He is not in acute distress.    Appearance: He is well-developed. He is not diaphoretic.  HENT:     Head: Normocephalic and atraumatic.  Eyes:     General: No scleral icterus.    Conjunctiva/sclera: Conjunctivae normal.  Cardiovascular:     Rate and Rhythm: Normal rate and regular rhythm.     Heart sounds: Normal heart sounds.  Pulmonary:     Effort: Pulmonary effort is normal. No respiratory distress.      Breath sounds: Normal breath sounds.  Abdominal:     Palpations: Abdomen is soft.     Tenderness: There is no abdominal tenderness.  Musculoskeletal:     Cervical back: Normal range of motion and neck supple.  Skin:    General: Skin is warm and dry.  Neurological:     Mental Status: He is alert.  Psychiatric:        Attention and Perception: Attention and perception normal.        Speech: Speech normal.        Behavior: Behavior normal.        Thought Content: Thought content normal.     ED Results / Procedures / Treatments   Labs (all labs ordered are listed, but only abnormal results are displayed) Labs Reviewed - No data to display  EKG None  Radiology MR ANKLE LEFT W WO CONTRAST  Result Date: 11/26/2021 CLINICAL DATA:  Soft tissue infection suspected. EXAM: MRI OF THE LEFT ANKLE WITHOUT AND WITH CONTRAST TECHNIQUE: Multiplanar, multisequence MR imaging of the ankle was performed before and after the administration of intravenous contrast. CONTRAST:  56mL GADAVIST GADOBUTROL 1 MMOL/ML IV SOLN COMPARISON:  Radiographs dated November 26, 2021 FINDINGS: TENDONS Peroneal: Peroneal longus tendon intact. Peroneal brevis intact. Posteromedial: Posterior tibial tendon  intact. Flexor hallucis longus tendon intact. Flexor digitorum longus tendon intact. There is trace amount of fluid along the tibialis posterior and moderate amount of fluid along the flexor hallucis longus tendon. Anterior: Tibialis anterior tendon intact. Extensor hallucis longus tendon intact Extensor digitorum longus tendon intact. Achilles:  Intact. Plantar Fascia: Intact. LIGAMENTS Lateral: Disruption and edema along the anterior talofibular ligament concerning for full-thickness tear. Calcaneofibular ligament intact. Posterior talofibular ligament intact. Anterior and posterior tibiofibular ligaments intact. Medial: Deltoid ligament intact. Spring ligament intact. CARTILAGE Ankle Joint: Moderate ankle joint effusion. Normal  ankle mortise. No chondral defect. Subtalar Joints/Sinus Tarsi: Normal subtalar joints. No subtalar joint effusion. Normal sinus tarsi. Bones: No marrow signal abnormality.  No fracture or dislocation. Soft Tissue: Skin thickening and subcutaneous soft tissue edema. No fluid collection or abscess. No fluid collection or hematoma. Muscles are normal without edema or atrophy. Tarsal tunnel is normal. IMPRESSION: IMPRESSION 1.  No evidence of fracture or dislocation. 2.  Moderate ankle joint effusion. 3. Disruption and edema of the anterior talofibular ligament suggesting full-thickness tear. 4. Moderate edema about the ankle without evidence of fluid collection or abscess. Electronically Signed   By: Larose Hires D.O.   On: 11/26/2021 08:39   DG Foot Complete Left  Result Date: 11/26/2021 CLINICAL DATA:  Foot pain EXAM: LEFT FOOT - COMPLETE 3+ VIEW COMPARISON:  None Available. FINDINGS: There is no evidence of fracture or dislocation. There is no evidence of arthropathy or other focal bone abnormality. Soft tissues are unremarkable. IMPRESSION: Negative. Electronically Signed   By: Deatra Robinson M.D.   On: 11/26/2021 04:01   DG Ankle Complete Left  Result Date: 11/26/2021 CLINICAL DATA:  Ankle trauma EXAM: LEFT ANKLE COMPLETE - 3+ VIEW COMPARISON:  None Available. FINDINGS: There is no evidence of fracture, dislocation, or joint effusion. There is no evidence of arthropathy or other focal bone abnormality. Circumferential soft tissue swelling. IMPRESSION: Circumferential soft tissue swelling without fracture or dislocation of the left ankle. Electronically Signed   By: Deatra Robinson M.D.   On: 11/26/2021 04:00    Procedures Procedures    Medications Ordered in ED Medications - No data to display  ED Course/ Medical Decision Making/ A&P                           Medical Decision Making Patient here with hallucinations after stimulant misuse. He denies suicidal or homicidal ideation.  He does not  appear to be overtly psychotic and does not appear to be in capable of carrying on conversation in a logical and thoughtful order.  Patient does not appear to be responding to internal stimuli.  Given these facts he does not appear to have a psychological emergency.  Patient will be discharged at this time.  Given outpatient resources.  Discussed outpatient follow-up with behavioral health urgent care.  Patient appears otherwise appropriate for discharge at this time.           Final Clinical Impression(s) / ED Diagnoses Final diagnoses:  Stimulant abuse (HCC)  Hallucinations    Rx / DC Orders ED Discharge Orders     None         Arthor Captain, PA-C 11/27/21 1728    Eber Hong, MD 11/28/21 1054

## 2021-11-27 NOTE — ED Notes (Signed)
Pt covered up with a blanket in bed.

## 2021-11-27 NOTE — Discharge Instructions (Addendum)
Please avoid using drugs.  This frequently causes people to have mental health problems that include hallucinations and anxiety.  It is important to get plenty of sleep, exercise and healthy diet.  This can help improve your mental health.  You should follow-up at the behavioral health urgent care.  The address is listed above. They have expanded mental health services including drug and alcohol services as well as help with mental health issues.  The emergency department is for acute psychiatric emergencies only.

## 2023-09-15 ENCOUNTER — Emergency Department (HOSPITAL_COMMUNITY)
Admission: EM | Admit: 2023-09-15 | Discharge: 2023-09-16 | Discharge status: Against Medical Advice | Payer: Self-pay | Attending: Emergency Medicine | Admitting: Emergency Medicine

## 2023-09-15 ENCOUNTER — Other Ambulatory Visit: Payer: Self-pay

## 2023-09-15 DIAGNOSIS — F19951 Other psychoactive substance use, unspecified with psychoactive substance-induced psychotic disorder with hallucinations: Secondary | ICD-10-CM | POA: Insufficient documentation

## 2023-09-15 DIAGNOSIS — F17210 Nicotine dependence, cigarettes, uncomplicated: Secondary | ICD-10-CM | POA: Insufficient documentation

## 2023-09-15 LAB — ETHANOL: Alcohol, Ethyl (B): <15 mg/dL (ref <15)

## 2023-09-15 LAB — CBC WITH DIFFERENTIAL/PLATELET
Abs Immature Granulocytes: 0.02 10*3/uL (ref 0.00–0.07)
Basophils Absolute: 0 10*3/uL (ref 0.0–0.1)
Basophils Relative: 0 %
Eosinophils Absolute: 0 10*3/uL (ref 0.0–0.5)
Eosinophils Relative: 1 %
HCT: 37.6 % — ABNORMAL LOW (ref 39.0–52.0)
Hemoglobin: 13 g/dL (ref 13.0–17.0)
Immature Granulocytes: 0 %
Lymphocytes Relative: 10 %
Lymphs Abs: 0.8 10*3/uL (ref 0.7–4.0)
MCH: 30 pg (ref 26.0–34.0)
MCHC: 34.6 g/dL (ref 30.0–36.0)
MCV: 86.8 fL (ref 80.0–100.0)
Monocytes Absolute: 0.8 10*3/uL (ref 0.1–1.0)
Monocytes Relative: 10 %
Neutro Abs: 6.3 10*3/uL (ref 1.7–7.7)
Neutrophils Relative %: 79 %
Platelets: 230 10*3/uL (ref 150–400)
RBC: 4.33 MIL/uL (ref 4.22–5.81)
RDW: 12.8 % (ref 11.5–15.5)
WBC: 8 10*3/uL (ref 4.0–10.5)
nRBC: 0 % (ref 0.0–0.2)

## 2023-09-15 LAB — COMPREHENSIVE METABOLIC PANEL WITH GFR
ALT: 25 U/L (ref 0–44)
AST: 23 U/L (ref 15–41)
Albumin: 5.1 g/dL — ABNORMAL HIGH (ref 3.5–5.0)
Alkaline Phosphatase: 60 U/L (ref 38–126)
Anion gap: 11 (ref 5–15)
BUN: 14 mg/dL (ref 6–20)
CO2: 23 mmol/L (ref 22–32)
Calcium: 9.5 mg/dL (ref 8.9–10.3)
Chloride: 106 mmol/L (ref 98–111)
Creatinine, Ser: 0.94 mg/dL (ref 0.61–1.24)
GFR, Estimated: >60 mL/min (ref >60)
Glucose, Bld: 97 mg/dL (ref 70–99)
Potassium: 3.3 mmol/L — ABNORMAL LOW (ref 3.5–5.1)
Sodium: 140 mmol/L (ref 135–145)
Total Bilirubin: 0.5 mg/dL (ref 0.0–1.2)
Total Protein: 7.8 g/dL (ref 6.5–8.1)

## 2023-09-15 MED ORDER — ZIPRASIDONE MESYLATE 20 MG IM SOLR
INTRAMUSCULAR | Status: AC
  Administered 2023-09-15: 20 mg via INTRAMUSCULAR
  Filled 2023-09-15: qty 20

## 2023-09-15 MED ORDER — ZIPRASIDONE MESYLATE 20 MG IM SOLR
20.0000 mg | Freq: Once | INTRAMUSCULAR | Status: AC
Start: 2023-09-15 — End: 2023-09-15

## 2023-09-15 MED ORDER — LORAZEPAM 2 MG/ML IJ SOLN
4.0000 mg | Freq: Once | INTRAMUSCULAR | Status: AC
  Administered 2023-09-15: 4 mg via INTRAMUSCULAR
  Filled 2023-09-15: qty 2

## 2023-09-15 MED ORDER — STERILE WATER FOR INJECTION IJ SOLN
INTRAMUSCULAR | Status: AC
  Filled 2023-09-15: qty 10

## 2023-09-15 NOTE — ED Provider Notes (Signed)
 Patient resting comfortably after receiving sedative medications. Will reassess once he is more awake   Eldon Greenland, MD 09/15/23 2356

## 2023-09-15 NOTE — ED Triage Notes (Signed)
 [facility] from home. Patient called ems after doing a lot of meth today. Patient is paranoid there is a snake on his neck trying to kill him. Patient uncooperative and hard to redirect. Ems unable to get vitals. EDP, security, and [employer] waiting at bedside.

## 2023-09-15 NOTE — ED Provider Notes (Signed)
 [facility]-EM[facility]Y DEPT [facility] Emergency Department Provider Note MRN:  [id number]  Arrival date & time: 09/15/23     Chief Complaint   Methamphetamine Use   History of Present Illness   Scott Huber is a 33 y.o. year-old male with a history of bipolar disorder presenting to the ED with chief complaint of methamphetamine use.  Patient brought in by ambulance, in acute distress due to concerns that he has a snake wrapped around his neck and the snake is about to break his neck and kill him and choking out.  Patient admits to recent methamphetamine use.  Review of Systems  A thorough review of systems was obtained and all systems are negative except as noted in the HPI and PMH.   Patient's Health History    Past Medical History:  Diagnosis Date   ADHD    Anxiety    Bipolar 1 disorder (HCC)    Genital herpes    Panic attack    Stimulant abuse (HCC)     No past surgical history on file.  No family history on file.  Social History   Socioeconomic History   Marital status: Single    Spouse name: Not on file   Number of children: Not on file   Years of education: Not on file   Highest education level: Not on file  Occupational History   Not on file  Tobacco Use   Smoking status: Every Day    Current packs/day: 1.00    Types: Cigarettes   Smokeless tobacco: Never  Vaping Use   Vaping status: Never Used  Substance and Sexual Activity   Alcohol use: Yes    Comment: occ   Drug use: Yes    Types: Cocaine, Methamphetamines    Comment: heroin   Sexual activity: Yes    Birth control/protection: Post-menopausal  Other Topics Concern   Not on file  Social History Narrative   Not on file   Social Drivers of Health   Financial Resource Strain: Not on file  Food Insecurity: Not on file  Transportation Needs: Not on file  Physical Activity: Not on file  Stress: Not on file  Social Connections: Not on file  Intimate Partner Violence: Not on file     Physical  Exam   Vitals:   09/15/23 2215 09/15/23 2245  BP: (!) 97/53 (!) 98/55  Pulse: 90   Resp: 18   Temp:    SpO2: 95%     CONSTITUTIONAL: Anxious, actively hallucinating NEURO/PSYCH:  Alert and oriented x 3, no focal deficits EYES:  eyes equal and reactive ENT/NECK:  no LAD, no JVD CARDIO: Regular rate, well-perfused, normal S1 and S2 PULM:  CTAB no wheezing or rhonchi GI/GU:  non-distended, non-tender MSK/SPINE:  No gross deformities, no edema SKIN:  no rash, atraumatic   *Additional and/or pertinent findings included in MDM below  Diagnostic and Interventional Summary    EKG Interpretation Date/Time:    Ventricular Rate:    PR Interval:    QRS Duration:    QT Interval:    QTC Calculation:   R Axis:      Text Interpretation:         Labs Reviewed  COMPREHENSIVE METABOLIC PANEL WITH GFR - Abnormal; Notable for the following components:      Result Value   Potassium 3.3 (*)    Albumin 5.1 (*)    All other components within normal limits  CBC WITH DIFFERENTIAL/PLATELET - Abnormal; Notable for the following components:  HCT 37.6 (*)    All other components within normal limits  ETHANOL  RAPID URINE DRUG SCREEN, HOSP PERFORMED    No orders to display    Medications  sterile water  (preservative free) injection (  Given 09/15/23 1956)  ziprasidone  (GEODON ) injection 20 mg (20 mg Intramuscular Given 09/15/23 1955)  LORazepam  (ATIVAN ) injection 4 mg (4 mg Intramuscular Given 09/15/23 2003)     Procedures  /  Critical Care .Critical Care  Performed by: Edson Graces, MD Authorized by: Edson Graces, MD   Critical care provider statement:    Critical care time (minutes):  40   Critical care was necessary to treat or prevent imminent or life-threatening deterioration of the following conditions: Substance induced delirium with hallucinations.   Critical care was time spent personally by me on the following activities:  Development of treatment plan with patient or  surrogate, discussions with consultants, evaluation of patient's response to treatment, examination of patient, ordering and review of laboratory studies, ordering and review of radiographic studies, ordering and performing treatments and interventions, pulse oximetry, re-evaluation of patient's condition and review of old charts   ED Course and Medical Decision Making  Initial Impression and Ddx Patient is actively hallucinating snakes, 1 around his neck, others in the ER room.  This is causing him a lot of distress, unwilling to come down or sit down.  Suspect form of psychosis in the setting of substance use, namely methamphetamines.  Providing symptomatic management and will monitor closely.  Past medical/surgical history that increases complexity of ED encounter: Bipolar disorder, substance use disorder  Interpretation of Diagnostics I personally reviewed the Laboratory Testing and my interpretation is as follows: No significant blood count or electrolyte disturbance.    Patient Reassessment and Ultimate Disposition/Management     Patient now sleeping comfortably, will need time to metabolize, wake up, will need reassessment to see if his hallucinations persist.  Signed out to oncoming provider at shift change.  Patient management required discussion with the following services or consulting groups:  None  Complexity of Problems Addressed Acute illness or injury that poses threat of life of bodily function  Additional Data Reviewed and Analyzed Further history obtained from: Prior labs/imaging results  Additional Factors Impacting ED Encounter Risk Consideration of hospitalization  Merrick Abe. Harless Lien, MD [facility] Emergency Medicine [facility] [email or web address]  Final Clinical Impressions(s) / ED Diagnoses     ICD-10-CM   1. Hallucination, drug-induced [facility])  [id number]       ED Discharge Orders     None        Discharge Instructions Discussed with  and Provided to Patient:   Discharge Instructions   None      Edson Graces, MD 09/15/23 2252

## 2023-09-15 NOTE — ED Notes (Addendum)
 Pt convinced their is snake wrapped around his neck and will not sit down or let anyone touch him/ Security, MD and [employer] at bedside. Geodon  given per MD orders. Please just yelling "move, don't touch me, please, get out".   Unable to assess pt or vital signs at this time.

## 2023-09-16 LAB — RAPID URINE DRUG SCREEN, HOSP PERFORMED
Amphetamines: POSITIVE — AB
Barbiturates: NOT DETECTED
Benzodiazepines: NOT DETECTED
Cocaine: NOT DETECTED
Opiates: NOT DETECTED
Tetrahydrocannabinol: NOT DETECTED

## 2023-09-16 NOTE — ED Notes (Signed)
 Pt awake. EDP aware

## 2023-09-16 NOTE — ED Notes (Signed)
 Pt walked out of room stating he is leaving and will call his mom to pick him up. Pt seen walking to lobby.

## 2023-09-29 DIAGNOSIS — F1510 Other stimulant abuse, uncomplicated: Secondary | ICD-10-CM | POA: Insufficient documentation

## 2023-09-29 DIAGNOSIS — R519 Headache, unspecified: Secondary | ICD-10-CM | POA: Insufficient documentation

## 2023-09-29 DIAGNOSIS — F419 Anxiety disorder, unspecified: Secondary | ICD-10-CM | POA: Insufficient documentation

## 2023-09-29 DIAGNOSIS — F22 Delusional disorders: Secondary | ICD-10-CM | POA: Insufficient documentation

## 2023-09-29 DIAGNOSIS — R451 Restlessness and agitation: Secondary | ICD-10-CM | POA: Insufficient documentation

## 2023-09-30 ENCOUNTER — Encounter (HOSPITAL_COMMUNITY): Payer: Self-pay | Admitting: Emergency Medicine

## 2023-09-30 ENCOUNTER — Other Ambulatory Visit: Payer: Self-pay

## 2023-09-30 ENCOUNTER — Emergency Department (HOSPITAL_COMMUNITY)
Admission: EM | Admit: 2023-09-30 | Discharge: 2023-09-30 | Disposition: A | Discharge status: Home / Self Care | Payer: Self-pay | Attending: Emergency Medicine | Admitting: Emergency Medicine

## 2023-09-30 DIAGNOSIS — F22 Delusional disorders: Secondary | ICD-10-CM

## 2023-09-30 DIAGNOSIS — F1910 Other psychoactive substance abuse, uncomplicated: Secondary | ICD-10-CM | POA: Insufficient documentation

## 2023-09-30 DIAGNOSIS — F1510 Other stimulant abuse, uncomplicated: Secondary | ICD-10-CM

## 2023-09-30 MED ORDER — LORAZEPAM 1 MG PO TABS
1.0000 mg | ORAL_TABLET | Freq: Once | ORAL | Status: AC
  Administered 2023-09-30: 1 mg via ORAL
  Filled 2023-09-30: qty 1

## 2023-09-30 NOTE — Discharge Instructions (Signed)
 You were seen today after requesting help for substance abuse.  See resources provided.
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
## 2023-09-30 NOTE — ED Triage Notes (Signed)
 Patient reports he "really wants help to get off drugs". Patient seen earlier tonight and discharged. Upon arrival to ER, patient is alert and oriented, ambu

## 2023-09-30 NOTE — ED Triage Notes (Signed)
 Pt with c/o "pains in head over last 2 days". States he is a drug addict and pain is not constant but "just feels different". Pt c/o sacral pain and states had recent "fusion surgery".

## 2023-09-30 NOTE — ED Provider Notes (Signed)
 [place] EMERGENCY DEPARTMENT AT [facility] Provider Note   CSN: [id number] Arrival date & time: 09/30/23  0503     History  Chief Complaint  Patient presents with   Needs help    Scott Huber is a 33 y.o. male.  HPI     This is a 33 year old male who presents requesting help with substance abuse.  Was most recently seen and evaluated by myself just several hours ago.  At that time he had likely methamphetamine induced paranoia.  Patient is awake and alert.  He is requesting help.  Reports methamphetamine use, marijuana use, occasional opiate use.  Denies daily alcohol use or misuse.  Denies SI or HI.  States that he is visiting family here and "I just need to get off these drugs."  He has not sought any outpatient resources prior to coming to the emergency department.  Home Medications Prior to Admission medications   Not on File      Allergies    Hydrocodone and Vistaril [hydroxyzine hcl]    Review of Systems   Review of Systems  Respiratory:  Negative for shortness of breath.   Cardiovascular:  Negative for chest pain.  All other systems reviewed and are negative.   Physical Exam Updated Vital Signs BP 120/89 (BP Location: Right Arm)   Pulse (!) 101   Temp 97.7 F (36.5 C) (Oral)   Resp 18   Ht 1.753 m (5\' 9" )   Wt 92 kg   SpO2 97%   BMI 29.95 kg/m  Physical Exam Vitals and nursing note reviewed.  Constitutional:      Appearance: He is well-developed. He is not ill-appearing.     Comments: More calm than prior evaluation  HENT:     Head: Normocephalic and atraumatic.  Eyes:     Pupils: Pupils are equal, round, and reactive to light.  Cardiovascular:     Rate and Rhythm: Normal rate and regular rhythm.  Pulmonary:     Effort: Pulmonary effort is normal. No respiratory distress.  Abdominal:     Palpations: Abdomen is soft.  Musculoskeletal:     Cervical back: Neck supple.  Lymphadenopathy:     Cervical: No cervical adenopathy.   Skin:    General: Skin is warm and dry.  Neurological:     Mental Status: He is alert and oriented to person, place, and time.  Psychiatric:     Comments: Anxious but calm     ED Results / Procedures / Treatments   Labs (all labs ordered are listed, but only abnormal results are displayed) Labs Reviewed - No data to display  EKG None  Radiology No results found.  Procedures Procedures    Medications Ordered in ED Medications - No data to display  ED Course/ Medical Decision Making/ A&P                                 Medical Decision Making  This patient presents to the ED for concern of polysubstance abuse, this involves an extensive number of treatment options, and is a complaint that carries with it a high risk of complications and morbidity.  I considered the following differential and admission for this acute, potentially life threatening condition.  The differential diagnosis includes polysubstance abuse, misuse, overdose  MDM:    This is a 33 year old male who presents requesting help to get off drugs.  He is nontoxic.  He is more calm than at prior evaluation and does not appear paranoid at this time.  I had a long discussion with the patient that we do not provide inpatient services for substance abuse.  He is not homicidal or suicidal.  There does not appear to be an acute psychiatric emergency.  Discussed with him that I would provide him with outpatient resources.  (Labs, imaging, consults)  Labs: I Ordered, and personally interpreted labs.  The pertinent results include: None  Imaging Studies ordered: I ordered imaging studies including none I independently visualized and interpreted imaging. I agree with the radiologist interpretation  Additional history obtained from chart review.  External records from outside source obtained and reviewed including prior evaluations  Cardiac Monitoring: The patient was not maintained on a cardiac monitor.  If on  the cardiac monitor, I personally viewed and interpreted the cardiac monitored which showed an underlying rhythm of: N/A  Reevaluation: After the interventions noted above, I reevaluated the patient and found that they have :stayed the same  Social Determinants of Health:  polysubstance abuse  Disposition: Discharge  Co morbidities that complicate the patient evaluation  Past Medical History:  Diagnosis Date   ADHD    Anxiety    Bipolar 1 disorder (HCC)    Genital herpes    Panic attack    Stimulant abuse (HCC)      Medicines No orders of the defined types were placed in this encounter.   I have reviewed the patients home medicines and have made adjustments as needed  Problem List / ED Course: Problem List Items Addressed This Visit       Other   Polysubstance abuse (HCC) - Primary                Final Clinical Impression(s) / ED Diagnoses Final diagnoses:  Polysubstance abuse [facility])    Rx / DC Orders ED Discharge Orders     None         Arvell Pulsifer, Vonzella Guernsey, MD 09/30/23 [phone]
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
## 2023-10-03 ENCOUNTER — Encounter (HOSPITAL_COMMUNITY): Payer: Self-pay | Admitting: *Deleted

## 2023-10-03 ENCOUNTER — Other Ambulatory Visit: Payer: Self-pay

## 2023-10-03 ENCOUNTER — Emergency Department (HOSPITAL_COMMUNITY)
Admission: EM | Admit: 2023-10-03 | Discharge: 2023-10-03 | Disposition: A | Discharge status: Home / Self Care | Payer: Self-pay | Attending: Emergency Medicine | Admitting: Emergency Medicine

## 2023-10-03 ENCOUNTER — Emergency Department (HOSPITAL_COMMUNITY): Payer: Self-pay

## 2023-10-03 DIAGNOSIS — F1910 Other psychoactive substance abuse, uncomplicated: Secondary | ICD-10-CM | POA: Insufficient documentation

## 2023-10-03 DIAGNOSIS — R519 Headache, unspecified: Secondary | ICD-10-CM | POA: Insufficient documentation

## 2023-10-03 LAB — COMPREHENSIVE METABOLIC PANEL WITH GFR
ALT: 20 U/L (ref 0–44)
AST: 31 U/L (ref 15–41)
Albumin: 4.7 g/dL (ref 3.5–5.0)
Alkaline Phosphatase: 58 U/L (ref 38–126)
Anion gap: 12 (ref 5–15)
BUN: 17 mg/dL (ref 6–20)
CO2: 22 mmol/L (ref 22–32)
Calcium: 8.7 mg/dL — ABNORMAL LOW (ref 8.9–10.3)
Chloride: 101 mmol/L (ref 98–111)
Creatinine, Ser: 0.76 mg/dL (ref 0.61–1.24)
GFR, Estimated: >60 mL/min (ref >60)
Glucose, Bld: 88 mg/dL (ref 70–99)
Potassium: 3.1 mmol/L — ABNORMAL LOW (ref 3.5–5.1)
Sodium: 135 mmol/L (ref 135–145)
Total Bilirubin: 1.8 mg/dL — ABNORMAL HIGH (ref 0.0–1.2)
Total Protein: 7.3 g/dL (ref 6.5–8.1)

## 2023-10-03 LAB — CBC WITH DIFFERENTIAL/PLATELET
Abs Immature Granulocytes: 0.02 10*3/uL (ref 0.00–0.07)
Basophils Absolute: 0 10*3/uL (ref 0.0–0.1)
Basophils Relative: 1 %
Eosinophils Absolute: 0.3 10*3/uL (ref 0.0–0.5)
Eosinophils Relative: 5 %
HCT: 37.1 % — ABNORMAL LOW (ref 39.0–52.0)
Hemoglobin: 13 g/dL (ref 13.0–17.0)
Immature Granulocytes: 0 %
Lymphocytes Relative: 21 %
Lymphs Abs: 1.3 10*3/uL (ref 0.7–4.0)
MCH: 31 pg (ref 26.0–34.0)
MCHC: 35 g/dL (ref 30.0–36.0)
MCV: 88.3 fL (ref 80.0–100.0)
Monocytes Absolute: 0.8 10*3/uL (ref 0.1–1.0)
Monocytes Relative: 12 %
Neutro Abs: 3.9 10*3/uL (ref 1.7–7.7)
Neutrophils Relative %: 61 %
Platelets: 289 10*3/uL (ref 150–400)
RBC: 4.2 MIL/uL — ABNORMAL LOW (ref 4.22–5.81)
RDW: 13.2 % (ref 11.5–15.5)
WBC: 6.4 10*3/uL (ref 4.0–10.5)
nRBC: 0 % (ref 0.0–0.2)

## 2023-10-03 MED ORDER — POTASSIUM CHLORIDE CRYS ER 20 MEQ PO TBCR
40.0000 meq | EXTENDED_RELEASE_TABLET | Freq: Once | ORAL | Status: AC
  Administered 2023-10-03: 40 meq via ORAL
  Filled 2023-10-03: qty 2

## 2023-10-03 MED ORDER — KETOROLAC TROMETHAMINE 30 MG/ML IJ SOLN
30.0000 mg | Freq: Once | INTRAMUSCULAR | Status: DC
  Filled 2023-10-03: qty 1

## 2023-10-03 NOTE — ED Provider Notes (Signed)
 [place] EMERGENCY DEPARTMENT AT [facility] Provider Note   CSN: [id number] Arrival date & time: 10/03/23  1847     History  Chief Complaint  Patient presents with   Headache    Scott Huber is a 33 y.o. male past medical history significant for polysubstance abuse presents today for headache.  Patient mumbling and uncooperative mostly, however denies fever, chills, neck stiffness, chest pain, vision changes, or hearing changes.  Patient admits to meth and marijuana use.   Headache      Home Medications Prior to Admission medications   Not on File      Allergies    Hydrocodone and Vistaril [hydroxyzine hcl]    Review of Systems   Review of Systems  Neurological:  Positive for headaches.    Physical Exam Updated Vital Signs BP 126/76   Pulse 100   Temp 98.1 F (36.7 C) (Oral)   Resp 17   SpO2 100%  Physical Exam Vitals and nursing note reviewed.  Constitutional:      General: He is not in acute distress.    Appearance: He is well-developed.     Comments: Disheveled appearance  HENT:     Head: Normocephalic and atraumatic.     Comments: Tenderness to palpation of the posterior occiput without deformity, ecchymosis, wound, or erythema on exam. Eyes:     Extraocular Movements: Extraocular movements intact.     Conjunctiva/sclera: Conjunctivae normal.     Pupils: Pupils are equal, round, and reactive to light.  Cardiovascular:     Rate and Rhythm: Normal rate and regular rhythm.     Heart sounds: Normal heart sounds. No murmur heard. Pulmonary:     Effort: Pulmonary effort is normal. No respiratory distress.     Breath sounds: Normal breath sounds.  Abdominal:     Palpations: Abdomen is soft.     Tenderness: There is no abdominal tenderness.  Musculoskeletal:        General: No swelling.     Cervical back: Normal range of motion and neck supple. No rigidity.  Skin:    General: Skin is warm and dry.     Capillary Refill: Capillary refill  takes less than 2 seconds.  Neurological:     Mental Status: He is alert and oriented to person, place, and time.     GCS: GCS eye subscore is 4. GCS verbal subscore is 5. GCS motor subscore is 6.     Cranial Nerves: No cranial nerve deficit or dysarthria.  Psychiatric:        Mood and Affect: Mood normal.        Behavior: Behavior is uncooperative.     ED Results / Procedures / Treatments   Labs (all labs ordered are listed, but only abnormal results are displayed) Labs Reviewed  COMPREHENSIVE METABOLIC PANEL WITH GFR - Abnormal; Notable for the following components:      Result Value   Potassium 3.1 (*)    Calcium 8.7 (*)    Total Bilirubin 1.8 (*)    All other components within normal limits  CBC WITH DIFFERENTIAL/PLATELET - Abnormal; Notable for the following components:   RBC 4.20 (*)    HCT 37.1 (*)    All other components within normal limits    EKG None  Radiology CT Head Wo Contrast Result Date: 10/03/2023 CLINICAL DATA:  Sudden severe headache EXAM: CT HEAD WITHOUT CONTRAST TECHNIQUE: Contiguous axial images were obtained from the base of the skull through the vertex  without intravenous contrast. RADIATION DOSE REDUCTION: This exam was performed according to the departmental dose-optimization program which includes automated exposure control, adjustment of the mA and/or kV according to patient size and/or use of iterative reconstruction technique. COMPARISON:  None Available. FINDINGS: Brain: No mass,hemorrhage or extra-axial collection. Normal appearance of the parenchyma and CSF spaces. Vascular: No hyperdense vessel or unexpected vascular calcification. Skull: The visualized skull base, calvarium and extracranial soft tissues are normal. Sinuses/Orbits: No fluid levels or advanced mucosal thickening of the visualized paranasal sinuses. No mastoid or middle ear effusion. Normal orbits. Other: None. IMPRESSION: Normal head CT. Electronically Signed   By: Juanetta Nordmann M.D.    On: 10/03/2023 19:38    Procedures Procedures    Medications Ordered in ED Medications  potassium chloride  SA (KLOR-CON  M) CR tablet 40 mEq (has no administration in time range)  ketorolac  (TORADOL ) 30 MG/ML injection 30 mg (has no administration in time range)    ED Course/ Medical Decision Making/ A&P                                 Medical Decision Making Amount and/or Complexity of Data Reviewed Labs: ordered. Radiology: ordered.   This patient presents to the ED for concern of headache differential diagnosis includes headache, polysubstance abuse, brain bleed, arrhythmia, electrolyte abnormality  Additional history obtained   Additional history obtained from Electronic Medical Record External records from outside source obtained and reviewed including Care Everywhere   Lab Tests:  I Ordered, and personally interpreted labs.  The pertinent results include: Mild hypokalemia   Imaging Studies ordered:  I ordered imaging studies including CT head Noncon I independently visualized and interpreted imaging which showed normal head CT I agree with the radiologist interpretation  Problem List / ED Course:  Patient given oral potassium for mild hypokalemia and Toradol  for headache Considered for admission or further workup however patient's vital signs, physical exam, labs, and imaging and been reassuring.  Patient's symptoms likely due to illicit drug use.  Patient's potassium was repleted while in ED.  Patient given return precautions.  I feel patient is safe for discharge at this time.        Final Clinical Impression(s) / ED Diagnoses Final diagnoses:  Nonintractable headache, unspecified chronicity pattern, unspecified headache type  Polysubstance abuse [facility])    Rx / DC Orders ED Discharge Orders     None         Merryl Abraham 10/03/23 2022    Cheyenne Cotta, MD 10/05/23 [phone]

## 2023-10-03 NOTE — ED Triage Notes (Signed)
 Pt uncooperative with answering questions, pt keeps asking if I see any injuries to back of his head. c/o being cold. Pt admits to meth and marijuana use.

## 2023-10-03 NOTE — Discharge Instructions (Addendum)
 Today you were seen for a headache.  You may take Tylenol /Motrin  as needed for pain.  Please try to refrain from using illicit drugs.  Return to the ED if you have sudden onset of double vision, vomiting uncontrollably, or fever that does not go down with Tylenol  or Motrin .  Thank you for letting us  treat you today. After reviewing your labs and imaging, I feel you are safe to go home. Please follow up with your PCP in the next several days and provide them with your records from this visit. Return to the Emergency Room if pain becomes severe or symptoms worsen.

## 2023-10-04 ENCOUNTER — Other Ambulatory Visit: Payer: Self-pay

## 2023-10-04 ENCOUNTER — Emergency Department (HOSPITAL_COMMUNITY)
Admission: EM | Admit: 2023-10-04 | Discharge: 2023-10-04 | Disposition: A | Discharge status: Home / Self Care | Payer: Self-pay | Attending: Emergency Medicine | Admitting: Emergency Medicine

## 2023-10-04 ENCOUNTER — Encounter (HOSPITAL_COMMUNITY): Payer: Self-pay | Admitting: *Deleted

## 2023-10-04 DIAGNOSIS — Z765 Malingerer [conscious simulation]: Secondary | ICD-10-CM | POA: Insufficient documentation

## 2023-10-04 DIAGNOSIS — F1510 Other stimulant abuse, uncomplicated: Secondary | ICD-10-CM | POA: Insufficient documentation

## 2023-10-04 DIAGNOSIS — M791 Myalgia, unspecified site: Secondary | ICD-10-CM | POA: Insufficient documentation

## 2023-10-04 DIAGNOSIS — M549 Dorsalgia, unspecified: Secondary | ICD-10-CM | POA: Insufficient documentation

## 2023-10-04 NOTE — ED Triage Notes (Signed)
 Patient presents saying he is "whoozy and stuff". Upon arrival, patient is alert and oriented, ambu with no difficulty
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
## 2023-10-04 NOTE — ED Provider Notes (Signed)
   [place] EMERGENCY DEPARTMENT AT [facility]  Provider Note  CSN: [id number] Arrival date & time: 10/04/23 0548  History Chief Complaint  Patient presents with   "whoozy"    Scott Huber is a 33 y.o. male with history of polysubstance abuse, primarily methamphetamines was seen in the ED about an hour ago for pain all over. He has had several recent visits for similar including yesterday when he had a reassuring workup including labs and CT head. He was given food and discharged and immediately checked back in asking for something else to eat. He has not new complaints since his previous visit. He was unaware that he was here yesterday or twice on 5/23.    Home Medications Prior to Admission medications   Not on File     Allergies    Hydrocodone and Vistaril [hydroxyzine hcl]   Review of Systems   Review of Systems Please see HPI for pertinent positives and negatives  Physical Exam BP (!) 110/97 (BP Location: Right Arm)   Pulse 75   Temp 97.9 F (36.6 C) (Oral)   Resp 18   Ht 5\' 9"  (1.753 m)   Wt 92 kg   SpO2 100%   BMI 29.95 kg/m   Physical Exam Vitals and nursing note reviewed.  HENT:     Head: Normocephalic.     Nose: Nose normal.  Eyes:     Extraocular Movements: Extraocular movements intact.  Pulmonary:     Effort: Pulmonary effort is normal.  Musculoskeletal:        General: Normal range of motion.     Cervical back: Neck supple.  Skin:    Findings: No rash (on exposed skin).  Neurological:     Mental Status: He is alert and oriented to person, place, and time.  Psychiatric:        Mood and Affect: Mood normal.     ED Results / Procedures / Treatments   EKG None  Procedures Procedures  Medications Ordered in the ED Medications - No data to display  Initial Impression and Plan  Patient checked back in with vague symptoms of pain all over and 'feeling woozy'. He has normal vitals, unremarkable exam and recent negative workup.  He was again advised no emergent medical condition is identified. No indication for additional ED workup and stable for discharge.   ED Course       MDM Rules/Calculators/A&P Medical Decision Making Problems Addressed: Malingering: acute illness or injury     Final Clinical Impression(s) / ED Diagnoses Final diagnoses:  Malingering    Rx / DC Orders ED Discharge Orders     None        Charmayne Cooper, MD 10/04/23 [phone]

## 2023-10-05 ENCOUNTER — Encounter (HOSPITAL_COMMUNITY): Payer: Self-pay | Admitting: Emergency Medicine

## 2023-10-05 ENCOUNTER — Other Ambulatory Visit: Payer: Self-pay

## 2023-10-05 ENCOUNTER — Emergency Department (HOSPITAL_COMMUNITY)
Admission: EM | Admit: 2023-10-05 | Discharge: 2023-10-05 | Discharge status: Against Medical Advice | Payer: Self-pay | Attending: Emergency Medicine | Admitting: Emergency Medicine

## 2023-10-05 DIAGNOSIS — M791 Myalgia, unspecified site: Secondary | ICD-10-CM | POA: Insufficient documentation

## 2023-10-05 DIAGNOSIS — Z5321 Procedure and treatment not carried out due to patient leaving prior to being seen by health care provider: Secondary | ICD-10-CM | POA: Insufficient documentation

## 2023-10-05 NOTE — ED Notes (Signed)
 Pt no longer visualized in lobby, assumed elopement

## 2023-10-05 NOTE — ED Triage Notes (Addendum)
 Pt in POV with reported constant pain all over - seen yesterday for same. Denies any recent drug use
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
## 2023-11-17 ENCOUNTER — Other Ambulatory Visit: Payer: Self-pay

## 2023-11-17 ENCOUNTER — Emergency Department (HOSPITAL_COMMUNITY)
Admission: EM | Admit: 2023-11-17 | Discharge: 2023-11-18 | Discharge status: Against Medical Advice | Payer: Self-pay | Attending: Emergency Medicine | Admitting: Emergency Medicine

## 2023-11-17 ENCOUNTER — Encounter (HOSPITAL_COMMUNITY): Payer: Self-pay | Admitting: Emergency Medicine

## 2023-11-17 DIAGNOSIS — Z79899 Other long term (current) drug therapy: Secondary | ICD-10-CM | POA: Insufficient documentation

## 2023-11-17 DIAGNOSIS — Y901 Blood alcohol level of 20-39 mg/100 ml: Secondary | ICD-10-CM | POA: Insufficient documentation

## 2023-11-17 DIAGNOSIS — F17210 Nicotine dependence, cigarettes, uncomplicated: Secondary | ICD-10-CM | POA: Insufficient documentation

## 2023-11-17 DIAGNOSIS — F15929 Other stimulant use, unspecified with intoxication, unspecified: Secondary | ICD-10-CM | POA: Insufficient documentation

## 2023-11-17 LAB — CBC WITH DIFFERENTIAL/PLATELET
Abs Immature Granulocytes: 0.02 K/uL (ref 0.00–0.07)
Basophils Absolute: 0.1 K/uL (ref 0.0–0.1)
Basophils Relative: 1 %
Eosinophils Absolute: 0.1 K/uL (ref 0.0–0.5)
Eosinophils Relative: 1 %
HCT: 37.3 % — ABNORMAL LOW (ref 39.0–52.0)
Hemoglobin: 13.1 g/dL (ref 13.0–17.0)
Immature Granulocytes: 0 %
Lymphocytes Relative: 17 %
Lymphs Abs: 1.3 K/uL (ref 0.7–4.0)
MCH: 31.2 pg (ref 26.0–34.0)
MCHC: 35.1 g/dL (ref 30.0–36.0)
MCV: 88.8 fL (ref 80.0–100.0)
Monocytes Absolute: 0.7 K/uL (ref 0.1–1.0)
Monocytes Relative: 9 %
Neutro Abs: 5.7 K/uL (ref 1.7–7.7)
Neutrophils Relative %: 72 %
Platelets: 275 K/uL (ref 150–400)
RBC: 4.2 MIL/uL — ABNORMAL LOW (ref 4.22–5.81)
RDW: 12.3 % (ref 11.5–15.5)
WBC: 7.8 K/uL (ref 4.0–10.5)
nRBC: 0 % (ref 0.0–0.2)

## 2023-11-17 MED ORDER — LORAZEPAM 2 MG/ML IJ SOLN
4.0000 mg | Freq: Once | INTRAMUSCULAR | Status: AC
  Administered 2023-11-17: 4 mg via INTRAMUSCULAR
  Filled 2023-11-17: qty 2

## 2023-11-17 MED ORDER — ZIPRASIDONE MESYLATE 20 MG IM SOLR
20.0000 mg | Freq: Once | INTRAMUSCULAR | Status: DC
  Filled 2023-11-17: qty 20

## 2023-11-17 MED ORDER — LORAZEPAM 2 MG/ML IJ SOLN
2.0000 mg | INTRAMUSCULAR | Status: DC | PRN
  Administered 2023-11-18: 2 mg via INTRAMUSCULAR
  Filled 2023-11-17 (×2): qty 1

## 2023-11-17 MED ORDER — STERILE WATER FOR INJECTION IJ SOLN
INTRAMUSCULAR | Status: AC
  Filled 2023-11-17: qty 10
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
## 2023-11-18 LAB — RAPID URINE DRUG SCREEN, HOSP PERFORMED
Amphetamines: POSITIVE — AB
Barbiturates: NOT DETECTED
Benzodiazepines: NOT DETECTED
Cocaine: POSITIVE — AB
Opiates: NOT DETECTED
Tetrahydrocannabinol: NOT DETECTED

## 2023-11-18 LAB — COMPREHENSIVE METABOLIC PANEL WITH GFR
ALT: 23 U/L (ref 0–44)
AST: 25 U/L (ref 15–41)
Albumin: 4.6 g/dL (ref 3.5–5.0)
Alkaline Phosphatase: 63 U/L (ref 38–126)
Anion gap: 19 — ABNORMAL HIGH (ref 5–15)
BUN: 9 mg/dL (ref 6–20)
CO2: 17 mmol/L — ABNORMAL LOW (ref 22–32)
Calcium: 9.2 mg/dL (ref 8.9–10.3)
Chloride: 107 mmol/L (ref 98–111)
Creatinine, Ser: 1.15 mg/dL (ref 0.61–1.24)
GFR, Estimated: >60 mL/min (ref >60)
Glucose, Bld: 178 mg/dL — ABNORMAL HIGH (ref 70–99)
Potassium: 3.1 mmol/L — ABNORMAL LOW (ref 3.5–5.1)
Sodium: 143 mmol/L (ref 135–145)
Total Bilirubin: 0.5 mg/dL (ref 0.0–1.2)
Total Protein: 7.7 g/dL (ref 6.5–8.1)

## 2023-11-18 LAB — ETHANOL: Alcohol, Ethyl (B): 37 mg/dL — ABNORMAL HIGH (ref <15)

## 2023-11-18 MED ORDER — LACTATED RINGERS IV BOLUS: 1000.0000 mL | Freq: Once | INTRAVENOUS | Status: DC

## 2023-11-18 NOTE — ED Notes (Signed)
 Patient asked for pants, so sitter gave burgundy scrubs to patient.

## 2023-11-18 NOTE — ED Notes (Signed)
 Patient remains anxious and paranoid about snakes.  Medicated per PRN order

## 2023-11-19 ENCOUNTER — Emergency Department (HOSPITAL_COMMUNITY)
Admission: EM | Admit: 2023-11-19 | Discharge: 2023-11-19 | Disposition: A | Discharge status: Home / Self Care | Payer: Self-pay | Attending: Emergency Medicine | Admitting: Emergency Medicine

## 2023-11-19 ENCOUNTER — Encounter (HOSPITAL_COMMUNITY): Payer: Self-pay

## 2023-11-19 ENCOUNTER — Other Ambulatory Visit: Payer: Self-pay

## 2023-11-19 DIAGNOSIS — F19150 Other psychoactive substance abuse with psychoactive substance-induced psychotic disorder with delusions: Secondary | ICD-10-CM | POA: Insufficient documentation

## 2023-11-19 DIAGNOSIS — F1510 Other stimulant abuse, uncomplicated: Secondary | ICD-10-CM | POA: Insufficient documentation

## 2023-11-19 MED ORDER — HALOPERIDOL LACTATE 5 MG/ML IJ SOLN: 10.0000 mg | Freq: Once | INTRAMUSCULAR | Status: DC

## 2023-11-19 MED ORDER — HALOPERIDOL LACTATE 5 MG/ML IJ SOLN
10.0000 mg | Freq: Once | INTRAMUSCULAR | Status: AC
  Administered 2023-11-19: 10 mg via INTRAMUSCULAR
  Filled 2023-11-19: qty 2

## 2023-11-19 NOTE — ED Triage Notes (Signed)
 Pt with paranoia and hallucinations. Meth use.

## 2023-11-19 NOTE — ED Notes (Signed)
 Pt given snacks and drink. PT ambulated in room and given paper shirt.

## 2023-11-19 NOTE — Discharge Instructions (Addendum)
 It was a pleasure caring for you today in the emergency department.  Please avoid methamphetamines and other illicit drugs in the future  Please return to the emergency department for any worsening or worrisome symptoms.    RESOURCE GUIDE  Chronic Pain Problems: Contact [facility]  [phone] Patients need to be referred by their primary care doctor.  Insufficient Money for Medicine: Contact United Way:  call [phone]  No Primary Care Doctor: Call Health Connect  [phone] - can help you locate a primary care doctor that  accepts your insurance, provides certain services, etc. Physician Referral Service- [phone]  Agencies that provide inexpensive medical care: [facility]  [phone] [facility]  [phone] [facility]  [phone] [facility]  [phone] [facility]  [phone] [facility]  [phone]  Medicaid-accepting [facility] Providers: [facility]- [address]  [phone], Mon-Fri 9am-7pm, Sat 9am-1pm [facility]- [address] [place], [address] [place]  [phone] [facility]- [address], [address] [place]  [phone] [facility]- [address]  [phone] Kennieth Leech- [address] [place], [address], [phone]  Only accepts [place] patients after they have their name  applied to their card  Self Pay (no insurance) in [facility]: Sickle Cell Patients - [facility]  [address] [place], [phone] [facility] Urgent Care- [address] [place]  [phone]       [place] [facility] [place]- [address]       -     [facility]- see information above (Speak to [employer] if you do not have insurance)       -  [facility]- [address],  [phone]       -  [facility]- [address], [phone]       -  Dr  Catalina-  [address], [place], [phone]       -  [facility] - [address], [phone]       -  [facility]- [address] [phone], also [address] [phone]       -     [facility]- [address] [place], [phone], 1st & 3rd Saturday         every month, 10am-1pm  -     [facility] and [facility]   [address] [place], [place].   Phone:  [phone], Fax:  [phone]. Hours of Operation:  9 am - 6 pm, M-F.  -     [facility]   [address], [place]   Phone: [phone], Fax: [phone]. Hours of Operation:  8:30 am - 5:30 pm, M-F.    Dental Assistance If unable to pay or uninsured, contact:  [facility]. to become qualified for the adult dental clinic.  Patients with Medicaid: [facility] [phone] [address], [phone] [address] [phone]  If unable to pay, or uninsured, contact [facility] [phone] in [place], [phone] in [facility]) to become qualified for the adult dental clinic  [facility] [address] [place], [place] [address] [phone] [email or web address]  Other IT consultant: [facility]- [phone]  [address] [place], [place], [address], [phone], Ext. [phone], 2nd and 4th Thursday of the month at 6:30am.  10 clients each day by appointment, can sometimes see walk-in patients if someone does not show for an appointment. [facility]- [address] [place], [place], [address], [phone] [facility] [address], [place], [place], [address], [phone] [facility] Health Department- [phone] [facility] Department- [phone] [facility] [phone]

## 2023-11-19 NOTE — ED Provider Notes (Signed)
 Provider Note MRN:  [id number]  Arrival date & time: 11/19/23    ED Course and Medical Decision Making  Assumed care from Dr Haze at shift change.  See note from prior team for complete details, in brief:  Clinical Course as of 11/19/23 1005  Sat Nov 19, 2023  0751 Handoff CJP MTF  [SG]    Clinical Course User Index [SG] Elnor Jayson LABOR, DO   Patient is feeling better, he is ambulatory, he is able to tolerate p.o. w/o difficulty, no longer having hallucinations .  Favor hallucinations earlier were likely substance-induced.  He is not acutely psychotic, no SI or HI.  Advised to avoid methamphetamines in the future.  Given outpatient substance abuse versus  Patient in no distress and overall condition is stable. Detailed discussions were had with the patient/guardian regarding current findings, and need for close f/u with PCP or on call doctor. The patient/guardian has been instructed to return immediately if the symptoms worsen in any way for re-evaluation. Patient/guardian verbalized understanding and is in agreement with current care plan. All questions answered prior to discharge.     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  Please avoid methamphetamines and other illicit drugs in the future  Please return to the emergency department for any worsening or worrisome symptoms.    RESOURCE GUIDE  Chronic Pain Problems: Contact [facility]  [phone] Patients need to be referred by their primary care doctor.  Insufficient Money for Medicine: Contact United Way:  call [phone]  No Primary Care Doctor: Call Health Connect  [phone] - can help you locate a primary care doctor that  accepts your insurance, provides certain  services, etc. Physician Referral Service- [phone]  Agencies that provide inexpensive medical care: [facility]  [phone] [facility]  [phone] [facility]  [phone] [facility]  [phone] [facility]  [phone] [facility]  [phone]  Medicaid-accepting [facility] Providers: [facility]- [address]  [phone], Mon-Fri 9am-7pm, Sat 9am-1pm [facility]- [address] [place], [address] [place]  [phone] [facility]- [address] [place]  [phone] [facility] Family Medicine- [address]  [phone] Kennieth Leech- [address] [place], [address], [phone]  Only accepts [place] patients after they have their name  applied to their card  Self Pay (no insurance) in [facility]: Sickle Cell Patients - [facility]  [address] [place], [phone] [facility]gent Care- [address] [place]  [phone]       [place] [facility] [place]- [address], [address]       -     [facility]- see information above (Speak to [employer] if you do not have insurance)       -  [facility]- [address],  [phone]       -  [facility]- [address] [phone]       -  Dr Catalina-  [phone], [place], [phone]       -  [facility] - [address] [phone]       -  [facility]- [address], [phone], also [address] [phone]       -     [facility]- [address] [place], [phone], 1st & 3rd Saturday         every month, 10am-1pm  -     [facility]   [address] [place], [place].   Phone:  [phone], Fax:  [phone]. Hours of Operation:  9 am - 6 pm, M-F.  -     [facility]   [address], [place]   Phone:  [phone], Fax: [phone]. Hours of Operation:  8:30 am - 5:30 pm, M-F.    Dental Assistance If unable to pay or uninsured, contact:  [facility]. to become qualified for the adult dental clinic.  Patients with Medicaid: [facility] [phone], [phone] [address], [phone]  If unable to pay, or uninsured, contact [facility] [phone] in [place], [phone] in [facility]) to become qualified for the adult dental clinic  [facility] [address] [place], [place] [address] [phone] [email or web address]  Other Proofreader Services: [facility]- [address] [place], [place], [place], [address], [phone], Ext. [phone], 2nd and 4th Thursday of the month at 6:30am.  10 clients each day by appointment, can sometimes see walk-in patients if someone does not show for an appointment. [facility]- [address] [place], [place], [address], [phone] [facility] [address], [place], [place], [address], [phone] [facility] Health Department- [phone] [facility] Health Department- [phone] [facility] Department- [phone]           Elnor Jayson LABOR, DO 11/19/23 1005

## 2023-11-19 NOTE — ED Provider Notes (Signed)
 [place] EMERGENCY DEPARTMENT AT [facility] Provider Note   CSN: [id number] Arrival date & time: 11/19/23  0246     Patient presents with: No chief complaint on file.   Scott Huber is a 33 y.o. male.   Brought to the emergency department by EMS after being found acting strangely at [facility].       Prior to Admission medications   Not on File    Allergies: Hydrocodone and Vistaril [hydroxyzine hcl]    Review of Systems  Updated Vital Signs BP (!) 143/90   Pulse (!) 123   Temp 97.7 F (36.5 C) (Oral)   SpO2 98%   Physical Exam Vitals and nursing note reviewed.  Constitutional:      General: He is not in acute distress.    Appearance: He is well-developed.  HENT:     Head: Normocephalic and atraumatic.     Mouth/Throat:     Mouth: Mucous membranes are moist.  Eyes:     General: Vision grossly intact. Gaze aligned appropriately.     Extraocular Movements: Extraocular movements intact.     Conjunctiva/sclera: Conjunctivae normal.  Cardiovascular:     Rate and Rhythm: Normal rate and regular rhythm.     Pulses: Normal pulses.     Heart sounds: Normal heart sounds, S1 normal and S2 normal. No murmur heard.    No friction rub. No gallop.  Pulmonary:     Effort: Pulmonary effort is normal. No respiratory distress.     Breath sounds: Normal breath sounds.  Abdominal:     Palpations: Abdomen is soft.     Tenderness: There is no abdominal tenderness. There is no guarding or rebound.     Hernia: No hernia is present.  Musculoskeletal:        General: No swelling.     Cervical back: Full passive range of motion without pain, normal range of motion and neck supple. No pain with movement, spinous process tenderness or muscular tenderness. Normal range of motion.     Right lower leg: No edema.     Left lower leg: No edema.  Skin:    General: Skin is warm and dry.     Capillary Refill: Capillary refill takes less than 2 seconds.     Findings: No  ecchymosis, erythema, lesion or wound.  Neurological:     Mental Status: He is alert and oriented to person, place, and time.     GCS: GCS eye subscore is 4. GCS verbal subscore is 5. GCS motor subscore is 6.     Cranial Nerves: Cranial nerves 2-12 are intact.     Sensory: Sensation is intact.     Motor: Motor function is intact. No weakness or abnormal muscle tone.     Coordination: Coordination is intact.  Psychiatric:        Mood and Affect: Mood normal.        Speech: Speech normal.        Behavior: Behavior is cooperative.        Thought Content: Thought content is paranoid.     (all labs ordered are listed, but only abnormal results are displayed) Labs Reviewed - No data to display  EKG: None  Radiology: No results found.   Procedures   Medications Ordered in the ED - No data to display  Medical Decision Making  Patient here once again with drug-induced psychosis.  Patient with long history of similar behaviors secondary to methamphetamine abuse.  Will monitor.     Final diagnoses:  Methamphetamine abuse [facility])    ED Discharge Orders     None          Haze Lonni PARAS, MD 11/19/23 0301

## 2023-11-20 ENCOUNTER — Emergency Department (HOSPITAL_COMMUNITY)
Admission: EM | Admit: 2023-11-20 | Discharge: 2023-11-20 | Disposition: A | Discharge status: Home / Self Care | Payer: Self-pay | Attending: Emergency Medicine | Admitting: Emergency Medicine

## 2023-11-20 ENCOUNTER — Other Ambulatory Visit: Payer: Self-pay

## 2023-11-20 ENCOUNTER — Encounter (HOSPITAL_COMMUNITY): Payer: Self-pay | Admitting: Emergency Medicine

## 2023-11-20 DIAGNOSIS — R6884 Jaw pain: Secondary | ICD-10-CM | POA: Insufficient documentation

## 2023-11-20 MED ORDER — CYCLOBENZAPRINE HCL 10 MG PO TABS
10.0000 mg | ORAL_TABLET | Freq: Once | ORAL | Status: AC
  Administered 2023-11-20: 10 mg via ORAL
  Filled 2023-11-20: qty 1

## 2023-11-20 MED ORDER — CYCLOBENZAPRINE HCL 10 MG PO TABS: 10.0000 mg | ORAL_TABLET | Freq: Two times a day (BID) | ORAL | 0 refills | Status: AC | PRN

## 2023-11-20 NOTE — Discharge Instructions (Signed)
 Begin taking ibuprofen  600 mg three times daily for the next three days.  Begin taking flexeril  as prescribed as needed for pain not relieved with ibuprofen .  Soft diet for the next several days, then advance slowly as tolerated.  Follow up with primary doctor if not improving in the next few days.

## 2023-11-20 NOTE — ED Notes (Signed)
 Pt refused dc vs.

## 2023-11-20 NOTE — ED Triage Notes (Signed)
 Pt via [facility] from home c/o jaw pain, stating he thinks his jaw is locked up. No prior hx of same. VSS. Pt ambulatory and in NAD at time of triage. Pain rated 8/10. Pt's jaw is moving with speech.

## 2023-11-20 NOTE — ED Provider Notes (Signed)
  [place] EMERGENCY DEPARTMENT AT [facility] Provider Note   CSN: [id number] Arrival date & time: 11/20/23  2211     Patient presents with: Jaw Pain   Scott Huber is a 33 y.o. male.   Patient is a 33 year old male with PMH of Bipolar Disorder, anxiety, ADHD, and methamphetamine abuse.  Patient presenting with complaints of his jaw locking up since earlier today.  He describes a spasm to his right jaw that occurs intermittently and is associated with difficulty speaking and swallowing.  No fever or chills.  No injury or trauma.       Prior to Admission medications   Not on File    Allergies: Hydrocodone and Vistaril [hydroxyzine hcl]    Review of Systems  All other systems reviewed and are negative.   Updated Vital Signs BP 120/82 (BP Location: Right Arm)   Pulse 91   Temp 98.4 F (36.9 C)   Resp 18   Ht 5' 9 (1.753 m)   Wt 91.6 kg   SpO2 97%   BMI 29.83 kg/m   Physical Exam Vitals and nursing note reviewed.  Constitutional:      Appearance: Normal appearance.  HENT:     Mouth/Throat:     Mouth: Mucous membranes are moist.     Pharynx: No oropharyngeal exudate or posterior oropharyngeal erythema.     Comments: Oral exam shows some dental decay, but is otherwise unremarkable.  No evidence for abscess, malocclusion, or trismus. Pulmonary:     Effort: Pulmonary effort is normal.     Breath sounds: No stridor.  Musculoskeletal:     Cervical back: Normal range of motion. No rigidity.  Lymphadenopathy:     Cervical: No cervical adenopathy.  Neurological:     Mental Status: He is alert and oriented to person, place, and time.     (all labs ordered are listed, but only abnormal results are displayed) Labs Reviewed - No data to display  EKG: None  Radiology: No results found.   Procedures   Medications Ordered in the ED  cyclobenzaprine  (FLEXERIL ) tablet 10 mg (has no administration in time range)                                     Medical Decision Making Risk Prescription drug management.   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  He will be treated with nsaids, soft diet, and muscle relaxers.  To follow up if not improving.     Final diagnoses:  None    ED Discharge Orders     None          Geroldine Berg, MD 11/20/23 2336

## 2023-11-23 NOTE — ED Notes (Signed)
 Need to print facesheet for security

## 2024-06-05 ENCOUNTER — Encounter (HOSPITAL_COMMUNITY): Payer: Self-pay

## 2024-06-05 ENCOUNTER — Other Ambulatory Visit: Payer: Self-pay

## 2024-06-05 ENCOUNTER — Emergency Department (HOSPITAL_COMMUNITY)
Admission: EM | Admit: 2024-06-05 | Discharge: 2024-06-06 | Payer: Self-pay | Attending: Emergency Medicine | Admitting: Emergency Medicine

## 2024-06-05 DIAGNOSIS — F1510 Other stimulant abuse, uncomplicated: Secondary | ICD-10-CM | POA: Insufficient documentation

## 2024-06-05 DIAGNOSIS — F319 Bipolar disorder, unspecified: Secondary | ICD-10-CM | POA: Insufficient documentation

## 2024-06-05 DIAGNOSIS — F909 Attention-deficit hyperactivity disorder, unspecified type: Secondary | ICD-10-CM | POA: Insufficient documentation

## 2024-06-05 DIAGNOSIS — A6000 Herpesviral infection of urogenital system, unspecified: Secondary | ICD-10-CM | POA: Insufficient documentation

## 2024-06-05 DIAGNOSIS — F419 Anxiety disorder, unspecified: Secondary | ICD-10-CM | POA: Insufficient documentation

## 2024-06-05 NOTE — ED Notes (Signed)
 Pt not allowing staff to take vitals at this time

## 2024-06-05 NOTE — ED Triage Notes (Signed)
 Pt has been doing meth for x3 days and is now hallucination and paranoia.

## 2024-06-05 NOTE — ED Provider Notes (Signed)
 " [place] [facility] AT [facility] Provider Note   CSN: [id number] Arrival date & time: 06/05/24  2307     Patient presents with: Hallucinations   Scott Huber is a 34 y.o. male.   Brought to the emergency department by ambulance with agitation.  Patient has been heavily using meth for days.  He has had multiple visits in the past with similar presentations.       Prior to Admission medications  Medication Sig Start Date End Date Taking? Authorizing Provider  cyclobenzaprine  (FLEXERIL ) 10 MG tablet Take 1 tablet (10 mg total) by mouth 2 (two) times daily as needed for muscle spasms. 11/20/23   Geroldine Berg, MD    Allergies: Hydrocodone and Vistaril [hydroxyzine hcl]    Review of Systems  Updated Vital Signs Resp (!) 154   SpO2 96%   Physical Exam Vitals and nursing note reviewed.  Constitutional:      General: He is not in acute distress.    Appearance: He is well-developed.  HENT:     Head: Normocephalic and atraumatic.     Mouth/Throat:     Mouth: Mucous membranes are moist.  Eyes:     General: Vision grossly intact. Gaze aligned appropriately.     Extraocular Movements: Extraocular movements intact.     Conjunctiva/sclera: Conjunctivae normal.  Cardiovascular:     Rate and Rhythm: Normal rate and regular rhythm.     Pulses: Normal pulses.     Heart sounds: Normal heart sounds, S1 normal and S2 normal. No murmur heard.    No friction rub. No gallop.  Pulmonary:     Effort: Pulmonary effort is normal. No respiratory distress.     Breath sounds: Normal breath sounds.  Abdominal:     Palpations: Abdomen is soft.     Tenderness: There is no abdominal tenderness. There is no guarding or rebound.     Hernia: No hernia is present.  Musculoskeletal:        General: No swelling.     Cervical back: Full passive range of motion without pain, normal range of motion and neck supple. No pain with movement, spinous process tenderness or muscular  tenderness. Normal range of motion.     Right lower leg: No edema.     Left lower leg: No edema.  Skin:    General: Skin is warm and dry.     Capillary Refill: Capillary refill takes less than 2 seconds.     Findings: No ecchymosis, erythema, lesion or wound.  Neurological:     Mental Status: He is alert and oriented to person, place, and time.     GCS: GCS eye subscore is 4. GCS verbal subscore is 5. GCS motor subscore is 6.     Cranial Nerves: Cranial nerves 2-12 are intact.     Sensory: Sensation is intact.     Motor: Motor function is intact. No weakness or abnormal muscle tone.     Coordination: Coordination is intact.  Psychiatric:        Mood and Affect: Mood normal.        Speech: Speech normal.        Behavior: Behavior is agitated.        Thought Content: Thought content does not include homicidal or suicidal ideation.     (all labs ordered are listed, but only abnormal results are displayed) Labs Reviewed - No data to display  EKG: None  Radiology: No results found.   Procedures  Medications Ordered in the ED - No data to display                                  Medical Decision Making  At arrival, patient is somewhat uncooperative.  He reports that he does not want to be seen here in the ED.  He is not homicidal or suicidal.  Agitation and presentation is consistent with methamphetamine abuse, seen multiple times in the past.  Patient is going to be taken by police to jail.  He is safe for discharge at this time.     Final diagnoses:  Methamphetamine abuse [facility])    ED Discharge Orders     None          Haze Lonni PARAS, MD 06/05/24 2359  "
